# Patient Record
Sex: Female | Born: 1945
Health system: Southern US, Community
[De-identification: ages and names within clinical notes are randomized; demographics above are authoritative.]

## PROBLEM LIST (undated history)

## (undated) DIAGNOSIS — N952 Postmenopausal atrophic vaginitis: Secondary | ICD-10-CM

## (undated) DIAGNOSIS — R5383 Other fatigue: Secondary | ICD-10-CM

## (undated) DIAGNOSIS — R748 Abnormal levels of other serum enzymes: Secondary | ICD-10-CM

## (undated) DIAGNOSIS — J309 Allergic rhinitis, unspecified: Secondary | ICD-10-CM

## (undated) DIAGNOSIS — L309 Dermatitis, unspecified: Secondary | ICD-10-CM

## (undated) DIAGNOSIS — I10 Essential (primary) hypertension: Secondary | ICD-10-CM

## (undated) DIAGNOSIS — M8580 Other specified disorders of bone density and structure, unspecified site: Secondary | ICD-10-CM

## (undated) DIAGNOSIS — I340 Nonrheumatic mitral (valve) insufficiency: Secondary | ICD-10-CM

## (undated) DIAGNOSIS — R519 Headache, unspecified: Secondary | ICD-10-CM

## (undated) DIAGNOSIS — K760 Fatty (change of) liver, not elsewhere classified: Secondary | ICD-10-CM

## (undated) DIAGNOSIS — R109 Unspecified abdominal pain: Secondary | ICD-10-CM

## (undated) DIAGNOSIS — E7800 Pure hypercholesterolemia, unspecified: Secondary | ICD-10-CM

## (undated) DIAGNOSIS — R079 Chest pain, unspecified: Secondary | ICD-10-CM

## (undated) DIAGNOSIS — R000 Tachycardia, unspecified: Secondary | ICD-10-CM

## (undated) HISTORY — DX: Allergic rhinitis, unspecified: J30.9

## (undated) HISTORY — DX: Postmenopausal atrophic vaginitis: N95.2

## (undated) HISTORY — PX: GASTROC RECESSION EXTREMITY: SHX6262

## (undated) HISTORY — DX: Fatty (change of) liver, not elsewhere classified: K76.0

## (undated) HISTORY — DX: Chest pain, unspecified: R07.9

## (undated) HISTORY — PX: OTHER SURGICAL HISTORY: SHX169

## (undated) HISTORY — PX: ABDOMINAL HYSTERECTOMY: SUR658

## (undated) HISTORY — DX: Other fatigue: R53.83

## (undated) HISTORY — DX: Unspecified abdominal pain: R10.9

## (undated) HISTORY — DX: Nonrheumatic mitral (valve) insufficiency: I34.0

## (undated) HISTORY — DX: Other specified disorders of bone density and structure, unspecified site: M85.80

## (undated) HISTORY — DX: Abnormal levels of other serum enzymes: R74.8

## (undated) HISTORY — DX: Dermatitis, unspecified: L30.9

## (undated) HISTORY — DX: Headache, unspecified: R51.9

## (undated) HISTORY — DX: Tachycardia, unspecified: R00.0

## (undated) HISTORY — DX: Pure hypercholesterolemia, unspecified: E78.00

## (undated) HISTORY — DX: Essential (primary) hypertension: I10

---

## 1998-12-30 ENCOUNTER — Other Ambulatory Visit: Admission: RE | Admit: 1998-12-30 | Discharge: 1998-12-30 | Payer: Self-pay | Admitting: *Deleted

## 1999-12-31 ENCOUNTER — Other Ambulatory Visit: Admission: RE | Admit: 1999-12-31 | Discharge: 1999-12-31 | Payer: Self-pay | Admitting: *Deleted

## 2000-08-11 ENCOUNTER — Ambulatory Visit (HOSPITAL_BASED_OUTPATIENT_CLINIC_OR_DEPARTMENT_OTHER): Admission: RE | Admit: 2000-08-11 | Discharge: 2000-08-12 | Payer: Self-pay | Admitting: Orthopedic Surgery

## 2001-01-03 ENCOUNTER — Other Ambulatory Visit: Admission: RE | Admit: 2001-01-03 | Discharge: 2001-01-03 | Payer: Self-pay | Admitting: *Deleted

## 2001-05-20 ENCOUNTER — Ambulatory Visit (HOSPITAL_COMMUNITY): Admission: RE | Admit: 2001-05-20 | Discharge: 2001-05-20 | Payer: Self-pay | Admitting: *Deleted

## 2002-02-15 ENCOUNTER — Ambulatory Visit (HOSPITAL_COMMUNITY): Admission: RE | Admit: 2002-02-15 | Discharge: 2002-02-15 | Payer: Self-pay | Admitting: Internal Medicine

## 2002-02-15 ENCOUNTER — Encounter: Payer: Self-pay | Admitting: Internal Medicine

## 2002-11-02 ENCOUNTER — Ambulatory Visit (HOSPITAL_COMMUNITY): Admission: RE | Admit: 2002-11-02 | Discharge: 2002-11-02 | Payer: Self-pay | Admitting: *Deleted

## 2003-01-11 ENCOUNTER — Other Ambulatory Visit: Admission: RE | Admit: 2003-01-11 | Discharge: 2003-01-11 | Payer: Self-pay | Admitting: Internal Medicine

## 2004-03-27 ENCOUNTER — Ambulatory Visit (HOSPITAL_COMMUNITY): Admission: RE | Admit: 2004-03-27 | Discharge: 2004-03-27 | Payer: Self-pay | Admitting: *Deleted

## 2004-04-11 ENCOUNTER — Ambulatory Visit (HOSPITAL_COMMUNITY): Admission: RE | Admit: 2004-04-11 | Discharge: 2004-04-11 | Payer: Self-pay | Admitting: *Deleted

## 2004-04-11 ENCOUNTER — Encounter (INDEPENDENT_AMBULATORY_CARE_PROVIDER_SITE_OTHER): Payer: Self-pay | Admitting: Specialist

## 2006-02-19 ENCOUNTER — Encounter: Admission: RE | Admit: 2006-02-19 | Discharge: 2006-02-19 | Payer: Self-pay | Admitting: Internal Medicine

## 2006-08-31 ENCOUNTER — Encounter: Admission: RE | Admit: 2006-08-31 | Discharge: 2006-11-29 | Payer: Self-pay | Admitting: Internal Medicine

## 2009-10-29 ENCOUNTER — Encounter: Admission: RE | Admit: 2009-10-29 | Discharge: 2009-10-29 | Payer: Self-pay | Admitting: Internal Medicine

## 2010-12-29 ENCOUNTER — Other Ambulatory Visit: Payer: Self-pay | Admitting: Dermatology

## 2011-05-01 NOTE — Procedures (Signed)
[facility]  Patient:    Kelsey Elliott, Kelsey Elliott                            MRN: [id number] Proc. Date: 05/20/01 Adm. Date:  [id number] Attending:  Sabino Gasser                           Procedure Report  PROCEDURE:  Colonoscopy.  INDICATION FOR PROCEDURE:  Rectal bleeding.  ANESTHESIA:  Demerol 80, Versed 8 mg.  DESCRIPTION OF PROCEDURE:  With the patient mildly sedated in the left lateral decubitus position, the Olympus videoscopic colonoscope was inserted in the rectum and passed under direct vision to the cecum identified by the ileocecal valve and appendiceal orifice both of which were photographed. The prep was slightly suboptimal in that there was brownish tenacious stool that was difficult to suction and had some particulate matter but we were able to clear it from most of the colon. From this point, the colonoscope was slowly withdrawn taking circumferential views of the entire colonic mucosa, stopping only to suction thecal material along the way until we reached the rectum which appeared normal in direct view and showed internal hemorrhoids on retroflexed view. The endoscope was straightened and withdrawn. The patients vital signs and pulse oximeter remained stable. The patient tolerated the procedure well without apparent complications.  FINDINGS:  Internal hemorrhoids otherwise unremarkable examination of the colon to the cecum limited somewhat by suboptimal prep but no gross lesions seen.  PLAN:   Have the patient followup with me in as an outpatient. DD:  05/20/01 TD:  05/20/01 Job: [id number]

## 2011-05-01 NOTE — Op Note (Signed)
   NAMEGAYLYNN, SEIPLE NO.:  [id number]   MEDICAL RECORD NO.:  [id number]                   PATIENT TYPE:  AMB   LOCATION:  ENDO                                 FACILITY:  [facility]   PHYSICIAN:  Georgiana Spinner, M.D.                 DATE OF BIRTH:  June 07, 1946   DATE OF PROCEDURE:  11/02/2002  DATE OF DISCHARGE:                                 OPERATIVE REPORT   PROCEDURE PERFORMED:  Upper endoscopy.   ENDOSCOPIST:  Georgiana Spinner, M.D.   INDICATIONS FOR PROCEDURE:  Abdominal pain, hepatitis.   ANESTHESIA:  Demerol 50 mg, Versed 5 mg.   DESCRIPTION OF PROCEDURE:  With the patient mildly sedated in the left  lateral decubitus position, the Olympus video endoscope was inserted in the  mouth and passed under direct vision through the esophagus which appeared  normal into the stomach.  The fundus, body, antrum, duodenal bulb and second  portion of the duodenum all appeared normal.  From this point, the endoscope  was slowly withdrawn taking circumferential views of the entire duodenal  mucosa until the endoscope was pulled back into the stomach and placed on  retroflexion to view the stomach from below.  The endoscope was then  straightened and withdrawn taking circumferential views of the remaining  gastric and esophageal mucosa.  The patient's vital signs and pulse oximeter  remained stable.  The patient tolerated the procedure well without apparent  complications.   FINDINGS:  This is a negative endoscopic examination.   PLAN:  Have patient follow up with me as an outpatient for further  evaluation of her hepatitis.                                                Georgiana Spinner, M.D.    GMO/MEDQ  D:  11/02/2002  T:  11/02/2002  Job:  [id number]

## 2011-05-01 NOTE — Op Note (Signed)
[facility]  Patient:    Kelsey Elliott, Kelsey Elliott                            MRN: [id number] Proc. Date: 08/11/00 Adm. Date:  [id number] Attending:  Ronne Binning                           Operative Report  PREOPERATIVE DIAGNOSIS:  Carpometacarpal pain, trapezial arthritis left wrist.  POSTOPERATIVE DIAGNOSIS:  Carpometacarpal pain, trapezial arthritis left wrist.  OPERATION:  Trapezium excision, suspension plasty left thumb.  SURGEON:  Nicki Reaper, M.D.  ASSISTANT:  Joaquin Courts, R.N.  ANESTHESIA:  Axillary block.  ANESTHESIOLOGIST:  Edwin Cap. Zoila Shutter, M.D.  HISTORY:  The patient is a 65 year old female with a history of pain and trapezial arthritis, which has not responded to conservative treatment.  She was brought to the operating room, where an axillary block was carried out without difficulty.   PROCEDURE:  She was prepped and draped using Betadine scrub and solution with the left arm free.  The limb was exsanguinated with Esmarch bandage, tourniquet placed high on the arm, was inflated to 250 mmHg.  A curvilinear incision was made over the carpometacarpal joint, carried down through subcutaneous tissue.  Bleeders were electrocauterized.  Radial nerve was identified and protected.  The interval between the extensor pollicis brevis and abductor pollicis longus was opened.  Significant arthritic changes were present with a very significant synovitis.  The radial artery was identified and protected and an incision was made down into the carpometacarpal joint. Significant arthritic changes were present.  The dissection was carried proximally to the STT joint.  The area was opened, a rongeur was then used to remove the bone piecemeal of the trapezium.  A significant erosion was present on the distal aspect of the scaphoid, significant degenerative changes and synovitis were present at the carpometacarpal joint.  The entire trapezium  was removed.  A drill hole was then placed in the dorsal to palmar aspect of the base of the first metacarpal.  This was in a dorsal to palmar direction.  This was brought out in the central aspect of the base of the metacarpal.  The hole was enlarged with a drill bit to approximately ______ size.  The abductor pollicis longus most dorsal aspect was then harvested.  Separate incision was then made at the musculotendinous junction.  A Carroll tendon retriever passed through the first dorsal compartment.  The abductor pollicis longus was ______ detached to the base of the first metacarpal.  This was transected proximally and delivered distally.  The one-third of the flexor carpi radialis was then harvested.  A separate incision was then made over its musculotendinous junction.  This was then delivered distally, splitting it, leaving it attached to the base of the second metacarpal.  The drill hole was then placed in the base of the second metacarpal.  Two Mellody Dance needles were then passed through this drill hole and out through the ulnar aspect of the mid portion of the metacarpal shaft.  Two 0 Ti-Cron sutures were passed of different colors.  The tendons were then woven into the drill hole in the base of the second metacarpal, pulled in snug.  The flexor carpi radialis was then brought through the drill hole in the base of the first metacarpal.  The abductor pollicis longus was also brought through this.  These were  woven together.  The suture for the flexor carpi radialis was tied over the base of the second metacarpal.  The abductor pollicis longus was then pulled into the hole of the second metacarpal base and this suture was also tied over the second metacarpal firmly securing it in position.  This was done with the thumb under full traction.  The tendons were then rewoven back through the drill hole and spiralled around the base of the metacarpal, sutured into position with 4-0 Mersilene  sutures.  An anchovy was then made with the remainder of the tendons, which were then sutured together and sutured to the flexor carpi radialis remainder.  This was done after irrigation, filling the space where the trapezium was removed.  The capsule was then closed with figure-of-eight 4-0 Vicryl sutures.  The subcutaneous tissues with 4-0 Vicryl and the skin wounds were closed with interrupted 5-0 nylon sutures.  A sterile compressive dressing and thumb spica splint was applied.  The patient tolerated the procedure well and was taken to the recovery room for observation in satisfactory condition.  She was admitted for overnight stay for pain control.  She will be discharged on Percocet and Keflex. DD:  08/11/00 TD:  08/11/00 Job: [id number]

## 2012-06-21 ENCOUNTER — Ambulatory Visit: Payer: Self-pay | Admitting: Sports Medicine

## 2012-07-12 ENCOUNTER — Ambulatory Visit (INDEPENDENT_AMBULATORY_CARE_PROVIDER_SITE_OTHER): Payer: MEDICARE | Admitting: Sports Medicine

## 2012-07-12 VITALS — BP 127/78

## 2012-07-12 DIAGNOSIS — M7741 Metatarsalgia, right foot: Secondary | ICD-10-CM | POA: Insufficient documentation

## 2012-07-12 DIAGNOSIS — M79672 Pain in left foot: Secondary | ICD-10-CM | POA: Insufficient documentation

## 2012-07-12 DIAGNOSIS — M7750 Other enthesopathy of unspecified foot: Secondary | ICD-10-CM

## 2012-07-12 DIAGNOSIS — M79609 Pain in unspecified limb: Secondary | ICD-10-CM

## 2012-07-12 DIAGNOSIS — M7742 Metatarsalgia, left foot: Secondary | ICD-10-CM | POA: Insufficient documentation

## 2012-07-12 DIAGNOSIS — M79671 Pain in right foot: Secondary | ICD-10-CM | POA: Insufficient documentation

## 2012-07-12 NOTE — Patient Instructions (Signed)
Please wear the orthotics with the metatarsal pads whenever you are active.   Please try to place metatarsal pads in the other shoes you were frequently, and make sure you wear shoes with a good long arch support.    Please make an appointment for follow up in about a month.

## 2012-07-12 NOTE — Assessment & Plan Note (Signed)
Likely due to collapse of transverse, long arches and pronation.  Her orthotics correct for the long arch and correct her pronation fairly well.  We have added metatarsal pads to her orthotics to correct the transverse arch.  We have sent her home with several more pairs for other shoes.  Follow up in one month.

## 2012-07-12 NOTE — Progress Notes (Signed)
  Subjective:    Patient ID: Kelsey Elliott, female    DOB: [birth year], 66 y.o.   MRN: [id number]  HPI  Kelsey Elliott comes in for left foot pain that has been going on for more than a year.  Her pain is in her forefoot, under the second metatarsal head.  She saw Dr. Fonnie Jarvis who did a gastroc slide surgery.  She says the pain is a little better after the surgery, but she still has a lot of pain in her feet.  She used to do a lot of walking for exercise and this has really limited her ability to do this.  She describes the pain as sudden, stabbing, and sharp, when she walks wrong or hits the bottom of her foot wrong.  No numbness or tingling of the toes or foot. She says that she takes great care to remove a callous on the bottom of her foot with a pumice stone.  She is starting to have some pain in her right foot too, in the same place.  She says she has had a few "twinges" of the same pain in that foot.    She has custom orthotics that were made at the PT place at [facility] orthopedics.  She says they help some with her foot pain.   Review of Systems See HPI    Objective:   Physical Exam BP 127/78 General appearance: alert, cooperative and no distress Feet:  Left foot has some collapse of the transverse and long arch with pronation.  + pain on dorsum of foot at head of second metatarsal Right foot has mild hallux valgus, hammer toes of 3-4, and collapse of transverse and long arches, pronation.  She has decreased varus of left heel with standing on toes compared to right.       Assessment & Plan:

## 2012-07-12 NOTE — Assessment & Plan Note (Signed)
With her history of significant calluses in the forefoot I believe she has classic metatarsalgia  This is helping create the hammertoes with a collapse of the transverse arch  I think we need to try to support this area and she walks more comfortably with metatarsal pads in all in the office  Recheck her after her one-month trial

## 2012-08-12 ENCOUNTER — Ambulatory Visit (INDEPENDENT_AMBULATORY_CARE_PROVIDER_SITE_OTHER): Payer: MEDICARE | Admitting: Sports Medicine

## 2012-08-12 VITALS — BP 123/79 | Ht 62.0 in | Wt 160.0 lb

## 2012-08-12 DIAGNOSIS — M7741 Metatarsalgia, right foot: Secondary | ICD-10-CM

## 2012-08-12 DIAGNOSIS — M7750 Other enthesopathy of unspecified foot: Secondary | ICD-10-CM

## 2012-08-12 DIAGNOSIS — M7742 Metatarsalgia, left foot: Secondary | ICD-10-CM

## 2012-08-12 NOTE — Patient Instructions (Signed)
Given verbal instructions

## 2012-08-12 NOTE — Assessment & Plan Note (Signed)
This is improved since last time we have seen her. Patient seems to be doing very well with her current regimen. Patient is now going to increase her walking regimen and see if she still doesn't have pain. Patient will continue to wear these rubber insoles that she had custom made previously. Once these to wear her out we will consider making her some other custom insoles here that would be most beneficial. Patient will followup as needed.

## 2012-08-12 NOTE — Progress Notes (Signed)
  Subjective:    Patient ID: Kelsey Elliott, female    DOB: [birth year], 66 y.o.   MRN: [id number]  HPI Patient is here for followup of her left foot pain. At last visit patient was given metatarsal pads to put in her custom orthotics. Patient has had a history of gastric slide surgery as well. Patient states since we added this metatarsal pads she is having decrease pain. Patient is even been able to start walking somewhat. Patient states overall she is about 60-70% better and hopefully is going to continue to improve. Patient states the only movement that seems to hurt tissue is when she pivots on the foot in one direction or the other but overall significantly better. Denies any radiation of pain denies any numbness or any swelling.    Review of Systems See HPI    Objective:   Physical Exam BP 123/79  Ht 5\' 2"  (1.575 m)  Wt 160 lb (72.576 kg)  BMI 29.26 kg/m2 General appearance: alert, cooperative and no distress Feet:  Left foot has some collapse of the transverse and long arch with pronation and valgus deformity of the hindfoot.Marland Kitchen  No pain on palpation.  NVI distally.      Assessment & Plan:

## 2012-08-22 ENCOUNTER — Other Ambulatory Visit: Payer: Self-pay | Admitting: Gastroenterology

## 2013-02-16 DIAGNOSIS — Z79899 Other long term (current) drug therapy: Secondary | ICD-10-CM | POA: Insufficient documentation

## 2013-02-16 DIAGNOSIS — E7800 Pure hypercholesterolemia, unspecified: Secondary | ICD-10-CM | POA: Insufficient documentation

## 2013-02-16 DIAGNOSIS — R748 Abnormal levels of other serum enzymes: Secondary | ICD-10-CM | POA: Insufficient documentation

## 2016-01-03 DIAGNOSIS — Z1231 Encounter for screening mammogram for malignant neoplasm of breast: Secondary | ICD-10-CM | POA: Diagnosis not present

## 2016-01-03 DIAGNOSIS — Z803 Family history of malignant neoplasm of breast: Secondary | ICD-10-CM | POA: Diagnosis not present

## 2016-01-08 DIAGNOSIS — E7800 Pure hypercholesterolemia, unspecified: Secondary | ICD-10-CM | POA: Diagnosis not present

## 2016-01-08 DIAGNOSIS — Z0001 Encounter for general adult medical examination with abnormal findings: Secondary | ICD-10-CM | POA: Diagnosis not present

## 2016-01-13 DIAGNOSIS — Z0000 Encounter for general adult medical examination without abnormal findings: Secondary | ICD-10-CM | POA: Diagnosis not present

## 2016-01-13 DIAGNOSIS — Z01419 Encounter for gynecological examination (general) (routine) without abnormal findings: Secondary | ICD-10-CM | POA: Diagnosis not present

## 2016-01-13 DIAGNOSIS — Z1212 Encounter for screening for malignant neoplasm of rectum: Secondary | ICD-10-CM | POA: Diagnosis not present

## 2016-02-12 DIAGNOSIS — H2513 Age-related nuclear cataract, bilateral: Secondary | ICD-10-CM | POA: Diagnosis not present

## 2016-02-12 DIAGNOSIS — H524 Presbyopia: Secondary | ICD-10-CM | POA: Diagnosis not present

## 2016-02-12 DIAGNOSIS — H52203 Unspecified astigmatism, bilateral: Secondary | ICD-10-CM | POA: Diagnosis not present

## 2016-03-10 DIAGNOSIS — R69 Illness, unspecified: Secondary | ICD-10-CM | POA: Diagnosis not present

## 2016-04-03 DIAGNOSIS — M2041 Other hammer toe(s) (acquired), right foot: Secondary | ICD-10-CM | POA: Diagnosis not present

## 2016-04-03 DIAGNOSIS — M2042 Other hammer toe(s) (acquired), left foot: Secondary | ICD-10-CM | POA: Diagnosis not present

## 2016-04-03 DIAGNOSIS — M65871 Other synovitis and tenosynovitis, right ankle and foot: Secondary | ICD-10-CM | POA: Diagnosis not present

## 2016-04-24 DIAGNOSIS — M2041 Other hammer toe(s) (acquired), right foot: Secondary | ICD-10-CM | POA: Diagnosis not present

## 2016-04-24 DIAGNOSIS — M2042 Other hammer toe(s) (acquired), left foot: Secondary | ICD-10-CM | POA: Diagnosis not present

## 2016-04-24 DIAGNOSIS — M65871 Other synovitis and tenosynovitis, right ankle and foot: Secondary | ICD-10-CM | POA: Diagnosis not present

## 2016-05-20 ENCOUNTER — Other Ambulatory Visit: Payer: Self-pay | Admitting: Podiatry

## 2016-05-20 DIAGNOSIS — M2042 Other hammer toe(s) (acquired), left foot: Secondary | ICD-10-CM | POA: Diagnosis not present

## 2016-05-20 DIAGNOSIS — M659 Synovitis and tenosynovitis, unspecified: Secondary | ICD-10-CM

## 2016-05-20 DIAGNOSIS — M65871 Other synovitis and tenosynovitis, right ankle and foot: Secondary | ICD-10-CM | POA: Diagnosis not present

## 2016-05-20 DIAGNOSIS — M25571 Pain in right ankle and joints of right foot: Secondary | ICD-10-CM

## 2016-05-20 DIAGNOSIS — M2041 Other hammer toe(s) (acquired), right foot: Secondary | ICD-10-CM | POA: Diagnosis not present

## 2016-05-25 ENCOUNTER — Ambulatory Visit
Admission: RE | Admit: 2016-05-25 | Discharge: 2016-05-25 | Disposition: A | Discharge status: Home / Self Care | Payer: Medicare HMO | Source: Ambulatory Visit | Attending: Podiatry | Admitting: Podiatry

## 2016-05-25 DIAGNOSIS — M659 Synovitis and tenosynovitis, unspecified: Secondary | ICD-10-CM

## 2016-05-25 DIAGNOSIS — M25571 Pain in right ankle and joints of right foot: Secondary | ICD-10-CM

## 2016-05-25 DIAGNOSIS — M25471 Effusion, right ankle: Secondary | ICD-10-CM | POA: Diagnosis not present

## 2016-06-01 DIAGNOSIS — M2042 Other hammer toe(s) (acquired), left foot: Secondary | ICD-10-CM | POA: Diagnosis not present

## 2016-06-01 DIAGNOSIS — M65871 Other synovitis and tenosynovitis, right ankle and foot: Secondary | ICD-10-CM | POA: Diagnosis not present

## 2016-06-01 DIAGNOSIS — M2041 Other hammer toe(s) (acquired), right foot: Secondary | ICD-10-CM | POA: Diagnosis not present

## 2016-06-10 DIAGNOSIS — M65871 Other synovitis and tenosynovitis, right ankle and foot: Secondary | ICD-10-CM | POA: Diagnosis not present

## 2016-06-12 DIAGNOSIS — M65871 Other synovitis and tenosynovitis, right ankle and foot: Secondary | ICD-10-CM | POA: Diagnosis not present

## 2016-06-30 DIAGNOSIS — M65871 Other synovitis and tenosynovitis, right ankle and foot: Secondary | ICD-10-CM | POA: Diagnosis not present

## 2016-06-30 DIAGNOSIS — M2042 Other hammer toe(s) (acquired), left foot: Secondary | ICD-10-CM | POA: Diagnosis not present

## 2016-06-30 DIAGNOSIS — M2041 Other hammer toe(s) (acquired), right foot: Secondary | ICD-10-CM | POA: Diagnosis not present

## 2016-07-02 DIAGNOSIS — M65871 Other synovitis and tenosynovitis, right ankle and foot: Secondary | ICD-10-CM | POA: Diagnosis not present

## 2016-07-06 DIAGNOSIS — M65871 Other synovitis and tenosynovitis, right ankle and foot: Secondary | ICD-10-CM | POA: Diagnosis not present

## 2016-07-10 DIAGNOSIS — M65871 Other synovitis and tenosynovitis, right ankle and foot: Secondary | ICD-10-CM | POA: Diagnosis not present

## 2016-09-01 DIAGNOSIS — M2041 Other hammer toe(s) (acquired), right foot: Secondary | ICD-10-CM | POA: Diagnosis not present

## 2016-09-01 DIAGNOSIS — M65871 Other synovitis and tenosynovitis, right ankle and foot: Secondary | ICD-10-CM | POA: Diagnosis not present

## 2016-10-14 DIAGNOSIS — R69 Illness, unspecified: Secondary | ICD-10-CM | POA: Diagnosis not present

## 2016-11-25 DIAGNOSIS — H903 Sensorineural hearing loss, bilateral: Secondary | ICD-10-CM | POA: Diagnosis not present

## 2017-01-04 DIAGNOSIS — Z803 Family history of malignant neoplasm of breast: Secondary | ICD-10-CM | POA: Diagnosis not present

## 2017-01-04 DIAGNOSIS — Z1231 Encounter for screening mammogram for malignant neoplasm of breast: Secondary | ICD-10-CM | POA: Diagnosis not present

## 2017-01-15 DIAGNOSIS — Z0000 Encounter for general adult medical examination without abnormal findings: Secondary | ICD-10-CM | POA: Diagnosis not present

## 2017-01-15 DIAGNOSIS — Z13220 Encounter for screening for lipoid disorders: Secondary | ICD-10-CM | POA: Diagnosis not present

## 2017-01-15 DIAGNOSIS — M8580 Other specified disorders of bone density and structure, unspecified site: Secondary | ICD-10-CM | POA: Diagnosis not present

## 2017-01-15 DIAGNOSIS — E7800 Pure hypercholesterolemia, unspecified: Secondary | ICD-10-CM | POA: Diagnosis not present

## 2017-01-15 DIAGNOSIS — Z79899 Other long term (current) drug therapy: Secondary | ICD-10-CM | POA: Diagnosis not present

## 2017-02-12 DIAGNOSIS — M2041 Other hammer toe(s) (acquired), right foot: Secondary | ICD-10-CM | POA: Diagnosis not present

## 2017-02-12 DIAGNOSIS — H52203 Unspecified astigmatism, bilateral: Secondary | ICD-10-CM | POA: Diagnosis not present

## 2017-02-12 DIAGNOSIS — H2513 Age-related nuclear cataract, bilateral: Secondary | ICD-10-CM | POA: Diagnosis not present

## 2017-02-12 DIAGNOSIS — M21961 Unspecified acquired deformity of right lower leg: Secondary | ICD-10-CM | POA: Diagnosis not present

## 2017-02-16 DIAGNOSIS — M2041 Other hammer toe(s) (acquired), right foot: Secondary | ICD-10-CM | POA: Diagnosis not present

## 2017-02-16 DIAGNOSIS — M24575 Contracture, left foot: Secondary | ICD-10-CM | POA: Diagnosis not present

## 2017-02-22 DIAGNOSIS — M24574 Contracture, right foot: Secondary | ICD-10-CM | POA: Diagnosis not present

## 2017-02-22 DIAGNOSIS — M2041 Other hammer toe(s) (acquired), right foot: Secondary | ICD-10-CM | POA: Diagnosis not present

## 2017-02-26 DIAGNOSIS — M2041 Other hammer toe(s) (acquired), right foot: Secondary | ICD-10-CM | POA: Diagnosis not present

## 2017-03-25 DIAGNOSIS — M2041 Other hammer toe(s) (acquired), right foot: Secondary | ICD-10-CM | POA: Diagnosis not present

## 2017-04-08 DIAGNOSIS — M2041 Other hammer toe(s) (acquired), right foot: Secondary | ICD-10-CM | POA: Diagnosis not present

## 2017-04-14 DIAGNOSIS — R69 Illness, unspecified: Secondary | ICD-10-CM | POA: Diagnosis not present

## 2017-04-27 DIAGNOSIS — E7800 Pure hypercholesterolemia, unspecified: Secondary | ICD-10-CM | POA: Diagnosis not present

## 2017-04-27 DIAGNOSIS — Z01419 Encounter for gynecological examination (general) (routine) without abnormal findings: Secondary | ICD-10-CM | POA: Diagnosis not present

## 2017-04-27 DIAGNOSIS — R5383 Other fatigue: Secondary | ICD-10-CM | POA: Diagnosis not present

## 2017-04-27 DIAGNOSIS — Z0000 Encounter for general adult medical examination without abnormal findings: Secondary | ICD-10-CM | POA: Diagnosis not present

## 2017-04-27 DIAGNOSIS — E559 Vitamin D deficiency, unspecified: Secondary | ICD-10-CM | POA: Diagnosis not present

## 2017-04-27 DIAGNOSIS — M8580 Other specified disorders of bone density and structure, unspecified site: Secondary | ICD-10-CM | POA: Diagnosis not present

## 2017-04-28 ENCOUNTER — Other Ambulatory Visit: Payer: Self-pay | Admitting: Internal Medicine

## 2017-04-28 DIAGNOSIS — R0989 Other specified symptoms and signs involving the circulatory and respiratory systems: Secondary | ICD-10-CM

## 2017-05-03 ENCOUNTER — Ambulatory Visit
Admission: RE | Admit: 2017-05-03 | Discharge: 2017-05-03 | Disposition: A | Discharge status: Home / Self Care | Payer: Medicare HMO | Source: Ambulatory Visit | Attending: Internal Medicine | Admitting: Internal Medicine

## 2017-05-03 DIAGNOSIS — I6523 Occlusion and stenosis of bilateral carotid arteries: Secondary | ICD-10-CM | POA: Diagnosis not present

## 2017-05-03 DIAGNOSIS — R0989 Other specified symptoms and signs involving the circulatory and respiratory systems: Secondary | ICD-10-CM

## 2017-05-26 DIAGNOSIS — H6121 Impacted cerumen, right ear: Secondary | ICD-10-CM | POA: Diagnosis not present

## 2017-08-19 DIAGNOSIS — M8580 Other specified disorders of bone density and structure, unspecified site: Secondary | ICD-10-CM | POA: Diagnosis not present

## 2017-08-19 DIAGNOSIS — M859 Disorder of bone density and structure, unspecified: Secondary | ICD-10-CM | POA: Diagnosis not present

## 2017-09-02 DIAGNOSIS — R69 Illness, unspecified: Secondary | ICD-10-CM | POA: Diagnosis not present

## 2017-10-28 DIAGNOSIS — M8580 Other specified disorders of bone density and structure, unspecified site: Secondary | ICD-10-CM | POA: Diagnosis not present

## 2017-11-18 DIAGNOSIS — R69 Illness, unspecified: Secondary | ICD-10-CM | POA: Diagnosis not present

## 2017-11-24 DIAGNOSIS — H903 Sensorineural hearing loss, bilateral: Secondary | ICD-10-CM | POA: Diagnosis not present

## 2017-11-24 DIAGNOSIS — H6123 Impacted cerumen, bilateral: Secondary | ICD-10-CM | POA: Diagnosis not present

## 2018-01-05 DIAGNOSIS — Z1231 Encounter for screening mammogram for malignant neoplasm of breast: Secondary | ICD-10-CM | POA: Diagnosis not present

## 2018-02-14 DIAGNOSIS — H2513 Age-related nuclear cataract, bilateral: Secondary | ICD-10-CM | POA: Diagnosis not present

## 2018-02-14 DIAGNOSIS — H52203 Unspecified astigmatism, bilateral: Secondary | ICD-10-CM | POA: Diagnosis not present

## 2018-04-26 DIAGNOSIS — N390 Urinary tract infection, site not specified: Secondary | ICD-10-CM | POA: Diagnosis not present

## 2018-04-26 DIAGNOSIS — E7800 Pure hypercholesterolemia, unspecified: Secondary | ICD-10-CM | POA: Diagnosis not present

## 2018-04-26 DIAGNOSIS — Z0000 Encounter for general adult medical examination without abnormal findings: Secondary | ICD-10-CM | POA: Diagnosis not present

## 2018-05-03 DIAGNOSIS — K635 Polyp of colon: Secondary | ICD-10-CM | POA: Diagnosis not present

## 2018-05-03 DIAGNOSIS — Z0000 Encounter for general adult medical examination without abnormal findings: Secondary | ICD-10-CM | POA: Diagnosis not present

## 2018-05-03 DIAGNOSIS — E7800 Pure hypercholesterolemia, unspecified: Secondary | ICD-10-CM | POA: Diagnosis not present

## 2018-05-03 DIAGNOSIS — Z01419 Encounter for gynecological examination (general) (routine) without abnormal findings: Secondary | ICD-10-CM | POA: Diagnosis not present

## 2018-05-17 DIAGNOSIS — L309 Dermatitis, unspecified: Secondary | ICD-10-CM | POA: Diagnosis not present

## 2018-05-17 DIAGNOSIS — J309 Allergic rhinitis, unspecified: Secondary | ICD-10-CM | POA: Diagnosis not present

## 2018-06-01 DIAGNOSIS — R69 Illness, unspecified: Secondary | ICD-10-CM | POA: Diagnosis not present

## 2018-09-04 DIAGNOSIS — R69 Illness, unspecified: Secondary | ICD-10-CM | POA: Diagnosis not present

## 2018-09-15 DIAGNOSIS — Z86010 Personal history of colonic polyps: Secondary | ICD-10-CM | POA: Diagnosis not present

## 2018-09-15 DIAGNOSIS — K648 Other hemorrhoids: Secondary | ICD-10-CM | POA: Diagnosis not present

## 2018-09-15 DIAGNOSIS — K5730 Diverticulosis of large intestine without perforation or abscess without bleeding: Secondary | ICD-10-CM | POA: Diagnosis not present

## 2018-11-23 DIAGNOSIS — T162XXA Foreign body in left ear, initial encounter: Secondary | ICD-10-CM | POA: Diagnosis not present

## 2018-11-23 DIAGNOSIS — H903 Sensorineural hearing loss, bilateral: Secondary | ICD-10-CM | POA: Diagnosis not present

## 2018-12-20 DIAGNOSIS — R69 Illness, unspecified: Secondary | ICD-10-CM | POA: Diagnosis not present

## 2018-12-26 DIAGNOSIS — R69 Illness, unspecified: Secondary | ICD-10-CM | POA: Diagnosis not present

## 2019-01-11 DIAGNOSIS — Z1231 Encounter for screening mammogram for malignant neoplasm of breast: Secondary | ICD-10-CM | POA: Diagnosis not present

## 2019-01-11 DIAGNOSIS — Z803 Family history of malignant neoplasm of breast: Secondary | ICD-10-CM | POA: Diagnosis not present

## 2019-02-17 DIAGNOSIS — H52203 Unspecified astigmatism, bilateral: Secondary | ICD-10-CM | POA: Diagnosis not present

## 2019-02-17 DIAGNOSIS — H2513 Age-related nuclear cataract, bilateral: Secondary | ICD-10-CM | POA: Diagnosis not present

## 2019-02-17 DIAGNOSIS — H40013 Open angle with borderline findings, low risk, bilateral: Secondary | ICD-10-CM | POA: Diagnosis not present

## 2019-06-21 DIAGNOSIS — R69 Illness, unspecified: Secondary | ICD-10-CM | POA: Diagnosis not present

## 2019-09-29 DIAGNOSIS — Z23 Encounter for immunization: Secondary | ICD-10-CM | POA: Diagnosis not present

## 2019-11-22 DIAGNOSIS — H6121 Impacted cerumen, right ear: Secondary | ICD-10-CM | POA: Diagnosis not present

## 2019-11-22 DIAGNOSIS — H903 Sensorineural hearing loss, bilateral: Secondary | ICD-10-CM | POA: Diagnosis not present

## 2019-12-31 ENCOUNTER — Ambulatory Visit: Payer: Medicare Other | Attending: Internal Medicine

## 2019-12-31 DIAGNOSIS — Z23 Encounter for immunization: Secondary | ICD-10-CM | POA: Insufficient documentation

## 2020-01-02 DIAGNOSIS — R69 Illness, unspecified: Secondary | ICD-10-CM | POA: Diagnosis not present

## 2020-01-17 DIAGNOSIS — Z1231 Encounter for screening mammogram for malignant neoplasm of breast: Secondary | ICD-10-CM | POA: Diagnosis not present

## 2020-01-19 ENCOUNTER — Ambulatory Visit: Payer: Medicare HMO | Attending: Internal Medicine

## 2020-01-19 DIAGNOSIS — Z23 Encounter for immunization: Secondary | ICD-10-CM | POA: Insufficient documentation

## 2020-01-19 NOTE — Progress Notes (Signed)
   Covid-19 Vaccination Clinic  Name:  AVYNN KLASSEN    MRN: [id number] DOB: Mar 18, 1946  01/19/2020  Ms. Pertuit was observed post Covid-19 immunization for 15 minutes without incidence. She was provided with Vaccine Information Sheet and instruction to access the V-Safe system.   Ms. Blacklock was instructed to call 911 with any severe reactions post vaccine: Marland Kitchen Difficulty breathing  . Swelling of your face and throat  . A fast heartbeat  . A bad rash all over your body  . Dizziness and weakness    Immunizations Administered    Name Date Dose VIS Date Route   Pfizer COVID-19 Vaccine 01/19/2020  2:47 PM 0.3 mL 11/24/2019 Intramuscular   Manufacturer: [employer], [employer]   Lot: [id number]   NDC: [address]-9

## 2020-02-13 DIAGNOSIS — M8580 Other specified disorders of bone density and structure, unspecified site: Secondary | ICD-10-CM | POA: Diagnosis not present

## 2020-02-13 DIAGNOSIS — E7800 Pure hypercholesterolemia, unspecified: Secondary | ICD-10-CM | POA: Diagnosis not present

## 2020-02-13 DIAGNOSIS — E559 Vitamin D deficiency, unspecified: Secondary | ICD-10-CM | POA: Diagnosis not present

## 2020-02-13 DIAGNOSIS — Z0000 Encounter for general adult medical examination without abnormal findings: Secondary | ICD-10-CM | POA: Diagnosis not present

## 2020-02-20 DIAGNOSIS — Z01419 Encounter for gynecological examination (general) (routine) without abnormal findings: Secondary | ICD-10-CM | POA: Diagnosis not present

## 2020-02-20 DIAGNOSIS — Z1212 Encounter for screening for malignant neoplasm of rectum: Secondary | ICD-10-CM | POA: Diagnosis not present

## 2020-02-20 DIAGNOSIS — M8589 Other specified disorders of bone density and structure, multiple sites: Secondary | ICD-10-CM | POA: Diagnosis not present

## 2020-02-20 DIAGNOSIS — Z0000 Encounter for general adult medical examination without abnormal findings: Secondary | ICD-10-CM | POA: Diagnosis not present

## 2020-02-20 DIAGNOSIS — M8580 Other specified disorders of bone density and structure, unspecified site: Secondary | ICD-10-CM | POA: Diagnosis not present

## 2020-02-21 DIAGNOSIS — H2513 Age-related nuclear cataract, bilateral: Secondary | ICD-10-CM | POA: Diagnosis not present

## 2020-02-21 DIAGNOSIS — H52203 Unspecified astigmatism, bilateral: Secondary | ICD-10-CM | POA: Diagnosis not present

## 2020-03-26 DIAGNOSIS — M810 Age-related osteoporosis without current pathological fracture: Secondary | ICD-10-CM | POA: Diagnosis not present

## 2020-04-18 DIAGNOSIS — H25811 Combined forms of age-related cataract, right eye: Secondary | ICD-10-CM | POA: Diagnosis not present

## 2020-04-18 DIAGNOSIS — H2511 Age-related nuclear cataract, right eye: Secondary | ICD-10-CM | POA: Diagnosis not present

## 2020-05-09 DIAGNOSIS — H2512 Age-related nuclear cataract, left eye: Secondary | ICD-10-CM | POA: Diagnosis not present

## 2020-05-28 DIAGNOSIS — M810 Age-related osteoporosis without current pathological fracture: Secondary | ICD-10-CM | POA: Diagnosis not present

## 2020-09-13 DIAGNOSIS — R69 Illness, unspecified: Secondary | ICD-10-CM | POA: Diagnosis not present

## 2020-09-24 DIAGNOSIS — Z961 Presence of intraocular lens: Secondary | ICD-10-CM | POA: Diagnosis not present

## 2020-10-15 DIAGNOSIS — R69 Illness, unspecified: Secondary | ICD-10-CM | POA: Diagnosis not present

## 2020-10-23 ENCOUNTER — Emergency Department (HOSPITAL_COMMUNITY): Payer: Medicare HMO

## 2020-10-23 ENCOUNTER — Encounter (HOSPITAL_COMMUNITY): Payer: Self-pay

## 2020-10-23 ENCOUNTER — Emergency Department (HOSPITAL_COMMUNITY)
Admission: EM | Admit: 2020-10-23 | Discharge: 2020-10-23 | Disposition: A | Discharge status: Home / Self Care | Payer: Medicare HMO | Attending: Emergency Medicine | Admitting: Emergency Medicine

## 2020-10-23 DIAGNOSIS — R030 Elevated blood-pressure reading, without diagnosis of hypertension: Secondary | ICD-10-CM | POA: Diagnosis not present

## 2020-10-23 DIAGNOSIS — I6782 Cerebral ischemia: Secondary | ICD-10-CM | POA: Diagnosis not present

## 2020-10-23 DIAGNOSIS — R519 Headache, unspecified: Secondary | ICD-10-CM

## 2020-10-23 DIAGNOSIS — R059 Cough, unspecified: Secondary | ICD-10-CM | POA: Diagnosis not present

## 2020-10-23 DIAGNOSIS — R000 Tachycardia, unspecified: Secondary | ICD-10-CM | POA: Diagnosis not present

## 2020-10-23 DIAGNOSIS — R002 Palpitations: Secondary | ICD-10-CM | POA: Insufficient documentation

## 2020-10-23 DIAGNOSIS — I10 Essential (primary) hypertension: Secondary | ICD-10-CM | POA: Diagnosis not present

## 2020-10-23 DIAGNOSIS — R531 Weakness: Secondary | ICD-10-CM | POA: Diagnosis not present

## 2020-10-23 LAB — BASIC METABOLIC PANEL
Anion gap: 10 (ref 5–15)
BUN: 12 mg/dL (ref 8–23)
CO2: 27 mmol/L (ref 22–32)
Calcium: 9.4 mg/dL (ref 8.9–10.3)
Chloride: 105 mmol/L (ref 98–111)
Creatinine, Ser: 0.6 mg/dL (ref 0.44–1.00)
GFR, Estimated: >60 mL/min (ref >60)
Glucose, Bld: 132 mg/dL — ABNORMAL HIGH (ref 70–99)
Potassium: 3.9 mmol/L (ref 3.5–5.1)
Sodium: 142 mmol/L (ref 135–145)

## 2020-10-23 LAB — D-DIMER, QUANTITATIVE: D-Dimer, Quant: 0.34 ug/mL-FEU (ref 0.00–0.50)

## 2020-10-23 LAB — URINALYSIS, ROUTINE W REFLEX MICROSCOPIC
Bilirubin Urine: NEGATIVE
Glucose, UA: NEGATIVE mg/dL
Hgb urine dipstick: NEGATIVE
Ketones, ur: NEGATIVE mg/dL
Leukocytes,Ua: NEGATIVE
Nitrite: NEGATIVE
Protein, ur: NEGATIVE mg/dL
Specific Gravity, Urine: 1.004 — ABNORMAL LOW (ref 1.005–1.030)
pH: 7 (ref 5.0–8.0)

## 2020-10-23 LAB — HEPATIC FUNCTION PANEL
ALT: 19 U/L (ref 0–44)
AST: 28 U/L (ref 15–41)
Albumin: 4.2 g/dL (ref 3.5–5.0)
Alkaline Phosphatase: 62 U/L (ref 38–126)
Bilirubin, Direct: 0.1 mg/dL (ref 0.0–0.2)
Indirect Bilirubin: 0.5 mg/dL (ref 0.3–0.9)
Total Bilirubin: 0.6 mg/dL (ref 0.3–1.2)
Total Protein: 7 g/dL (ref 6.5–8.1)

## 2020-10-23 LAB — CBC
HCT: 42.8 % (ref 36.0–46.0)
Hemoglobin: 14 g/dL (ref 12.0–15.0)
MCH: 30.8 pg (ref 26.0–34.0)
MCHC: 32.7 g/dL (ref 30.0–36.0)
MCV: 94.3 fL (ref 80.0–100.0)
Platelets: 204 10*3/uL (ref 150–400)
RBC: 4.54 MIL/uL (ref 3.87–5.11)
RDW: 12.7 % (ref 11.5–15.5)
WBC: 6.4 10*3/uL (ref 4.0–10.5)
nRBC: 0 % (ref 0.0–0.2)

## 2020-10-23 LAB — TROPONIN I (HIGH SENSITIVITY)
Troponin I (High Sensitivity): 2 ng/L (ref <18)
Troponin I (High Sensitivity): 2 ng/L (ref <18)

## 2020-10-23 LAB — PROTIME-INR
INR: 1 (ref 0.8–1.2)
Prothrombin Time: 12.8 seconds (ref 11.4–15.2)

## 2020-10-23 MED ORDER — SODIUM CHLORIDE 0.9 % IV BOLUS
500.0000 mL | Freq: Once | INTRAVENOUS | Status: AC
  Administered 2020-10-23: 500 mL via INTRAVENOUS

## 2020-10-23 MED ORDER — METOCLOPRAMIDE HCL 5 MG/ML IJ SOLN
10.0000 mg | Freq: Once | INTRAMUSCULAR | Status: AC
  Administered 2020-10-23: 10 mg via INTRAVENOUS
  Filled 2020-10-23: qty 2

## 2020-10-23 MED ORDER — IBUPROFEN 200 MG PO TABS
400.0000 mg | ORAL_TABLET | Freq: Once | ORAL | Status: AC
  Administered 2020-10-23: 400 mg via ORAL
  Filled 2020-10-23: qty 2

## 2020-10-23 NOTE — ED Triage Notes (Signed)
Pt presents with c/o chest palpitations and weakness. Pt was hypertensive for EMS, BP 180/100, no hx of same.

## 2020-10-23 NOTE — ED Provider Notes (Signed)
[facility]-EMERGENCY DEPT Provider Note   CSN: [id number] Arrival date & time: 10/23/20  1507     History Chief Complaint  Patient presents with  . Weakness  . Hypertension    Kelsey Elliott is a 74 y.o. female.  The history is provided by the patient and medical records.  Weakness Hypertension   Kelsey Elliott is a 74 y.o. female who presents to the Emergency Department complaining of weakness.  Sxs started this morning following her morning exercise routine.  She felt fluttering in her chest with mild HA.  She checked her HR and BP - HR was 100-104, BP 170/80.  She usually has normal blood pressure.  HA is waxing and waning.  Has mild cough due to PSND.    She denies chest pain, SOB, fever, abdominal pain,  N/V/D, leg swelling/pain.  No known sick contacts.  Has been fully vaccinated for COVID19 plus booster.  No significant medical problems.  No hx/o blood clots.        History reviewed. No pertinent past medical history.  Patient Active Problem List   Diagnosis Date Noted  . Foot pain, bilateral 07/12/2012  . Metatarsalgia of both feet 07/12/2012    History reviewed. No pertinent surgical history.   OB History   No obstetric history on file.     History reviewed. No pertinent family history.  Social History   Tobacco Use  . Smoking status: Not on file  Substance Use Topics  . Alcohol use: Not on file  . Drug use: Not on file    Home Medications Prior to Admission medications   Medication Sig Start Date End Date Taking? Authorizing Provider  Ascorbic Acid (VITAMIN C) 1000 MG tablet Take 1,000 mg by mouth daily.   Yes [provider]  cholecalciferol (VITAMIN D3) 25 MCG (1000 UNIT) tablet Take 1,000 Units by mouth daily.   Yes [provider]  ibuprofen (ADVIL) 200 MG tablet Take 400 mg by mouth every 6 (six) hours as needed for moderate pain.   Yes [provider]  Multiple Vitamin (MULTIVITAMIN WITH MINERALS)  TABS tablet Take 1 tablet by mouth daily.   Yes [provider]  Probiotic Product (PROBIOTIC ADVANCED PO) Take 1 capsule by mouth daily.   Yes [provider]    Allergies    Piroxicam, Compazine [prochlorperazine edisylate], and Peroxidase  Review of Systems   Review of Systems  Neurological: Positive for weakness.  All other systems reviewed and are negative.   Physical Exam Updated Vital Signs BP (!) 158/111   Pulse 91   Temp 98.1 F (36.7 C) (Oral)   Resp 14   SpO2 97%   Physical Exam Vitals and nursing note reviewed.  Constitutional:      Appearance: She is well-developed.  HENT:     Head: Normocephalic and atraumatic.  Cardiovascular:     Rate and Rhythm: Regular rhythm. Tachycardia present.     Heart sounds: No murmur heard.   Pulmonary:     Effort: Pulmonary effort is normal. No respiratory distress.     Breath sounds: Normal breath sounds.  Abdominal:     Palpations: Abdomen is soft.     Tenderness: There is no abdominal tenderness. There is no guarding or rebound.  Musculoskeletal:        General: No swelling or tenderness.  Skin:    General: Skin is warm and dry.  Neurological:     Mental Status: She is alert and  oriented to person, place, and time.  Psychiatric:        Behavior: Behavior normal.     ED Results / Procedures / Treatments   Labs (all labs ordered are listed, but only abnormal results are displayed) Labs Reviewed  BASIC METABOLIC PANEL - Abnormal; Notable for the following components:      Result Value   Glucose, Bld 132 (*)    All other components within normal limits  URINALYSIS, ROUTINE W REFLEX MICROSCOPIC - Abnormal; Notable for the following components:   Color, Urine STRAW (*)    Specific Gravity, Urine 1.004 (*)    All other components within normal limits  CBC  PROTIME-INR  HEPATIC FUNCTION PANEL  D-DIMER, QUANTITATIVE (NOT AT [facility])  TROPONIN I (HIGH SENSITIVITY)  TROPONIN I (HIGH SENSITIVITY)     EKG EKG Interpretation  Date/Time:  Wednesday October 23 2020 15:16:39 EST Ventricular Rate:  98 PR Interval:    QRS Duration: 81 QT Interval:  335 QTC Calculation: 428 R Axis:   42 Text Interpretation: Sinus rhythm Consider anterior infarct 12 Lead; Mason-Likar Confirmed by Tilden Fossa [id number]) on 10/23/2020 6:34:49 PM   Radiology CT Head Wo Contrast  Result Date: 10/23/2020 CLINICAL DATA:  74 year old female with headache. Concern for intracranial bleed. EXAM: CT HEAD WITHOUT CONTRAST TECHNIQUE: Contiguous axial images were obtained from the base of the skull through the vertex without intravenous contrast. COMPARISON:  None. FINDINGS: Brain: There is mild age-related atrophy and chronic microvascular ischemic changes. There is no acute intracranial hemorrhage. No mass effect or midline shift. No extra-axial fluid collection. Vascular: No hyperdense vessel or unexpected calcification. Skull: Normal. Negative for fracture or focal lesion. Sinuses/Orbits: No acute finding. Other: None IMPRESSION: 1. No acute intracranial pathology. 2. Mild age-related atrophy and chronic microvascular ischemic changes. Electronically Signed   By: Elgie Collard M.D.   On: 10/23/2020 19:53    Procedures Procedures (including critical care time)  Medications Ordered in ED Medications  sodium chloride 0.9 % bolus 500 mL (0 mLs Intravenous Stopped 10/23/20 2231)  metoCLOPramide (REGLAN) injection 10 mg (10 mg Intravenous Given 10/23/20 1927)  ibuprofen (ADVIL) tablet 400 mg (400 mg Oral Given 10/23/20 2231)    ED Course  I have reviewed the triage vital signs and the nursing notes.  Pertinent labs & imaging results that were available during my care of the patient were reviewed by me and considered in my medical decision making (see chart for details).    MDM Rules/Calculators/A&P                         Patient here for evaluation of elevated blood pressure, mild headache and fluttering  in her chest that began earlier today. On examination she is non-toxic, no focal neurologic deficits. EKG with sinus rhythm. She is noted to be hypertensive in the emergency department, blood pressure did improve with treatment of her headache. On reassessment headache is improved but still does have a mild headache. Labs without significant electrolyte abnormality, kidney injury or hepatic injury. UA is not consistent with UTI. CBC is within normal limits. Troponin's are negative times two. Current presentation is not consistent with CVA, hypertensive urgency, PE, ACS. Discussed with patient unclear source of her current symptoms. But do not start antihypertensives at this point in time given no prior history of hypertension. Discussed outpatient follow-up as well as return precautions.  Final Clinical Impression(s) / ED Diagnoses Final diagnoses:  Bad headache  Elevated blood  pressure reading in office without diagnosis of hypertension    Rx / DC Orders ED Discharge Orders    None       Tilden Fossa, MD 10/23/20 2243

## 2020-11-04 DIAGNOSIS — R000 Tachycardia, unspecified: Secondary | ICD-10-CM | POA: Diagnosis not present

## 2020-11-04 DIAGNOSIS — I10 Essential (primary) hypertension: Secondary | ICD-10-CM | POA: Diagnosis not present

## 2020-11-25 DIAGNOSIS — R69 Illness, unspecified: Secondary | ICD-10-CM | POA: Diagnosis not present

## 2020-11-27 DIAGNOSIS — H6121 Impacted cerumen, right ear: Secondary | ICD-10-CM | POA: Diagnosis not present

## 2020-11-27 DIAGNOSIS — H903 Sensorineural hearing loss, bilateral: Secondary | ICD-10-CM | POA: Diagnosis not present

## 2020-12-02 DIAGNOSIS — R000 Tachycardia, unspecified: Secondary | ICD-10-CM | POA: Diagnosis not present

## 2020-12-02 DIAGNOSIS — I10 Essential (primary) hypertension: Secondary | ICD-10-CM | POA: Diagnosis not present

## 2020-12-02 DIAGNOSIS — M810 Age-related osteoporosis without current pathological fracture: Secondary | ICD-10-CM | POA: Diagnosis not present

## 2020-12-04 ENCOUNTER — Telehealth: Payer: Self-pay

## 2020-12-04 NOTE — Telephone Encounter (Signed)
NOTES ON FILE FROM [facility] [phone], SENT REFERRAL TO SCHEDULING 

## 2020-12-08 IMAGING — CT CT HEAD W/O CM
3 series · 16 of 47 positions shown, 19 images · non-contrast
Comparison: None.

CLINICAL DATA: 74-year-old female with headache. Concern for
intracranial bleed.

EXAM:
CT HEAD WITHOUT CONTRAST
TECHNIQUE: Contiguous axial images were obtained from the base of the skull
through the vertex without intravenous contrast.

[Series 2: head wo · axial · 0.44mm/px · z∈[-188,-58]mm · 10 of 32 slices shown, 13 images]
[im 3/32  brain]
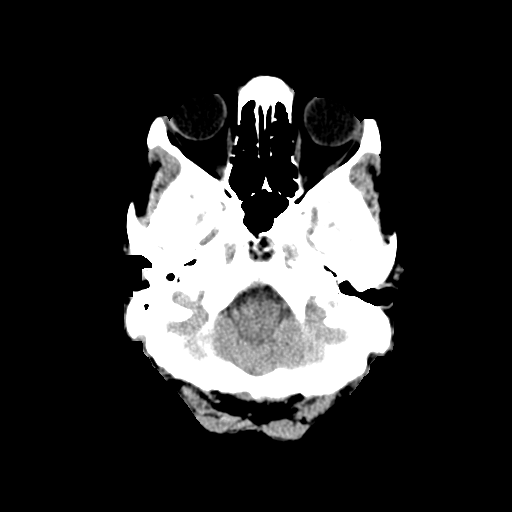
[im 3/32  bone]
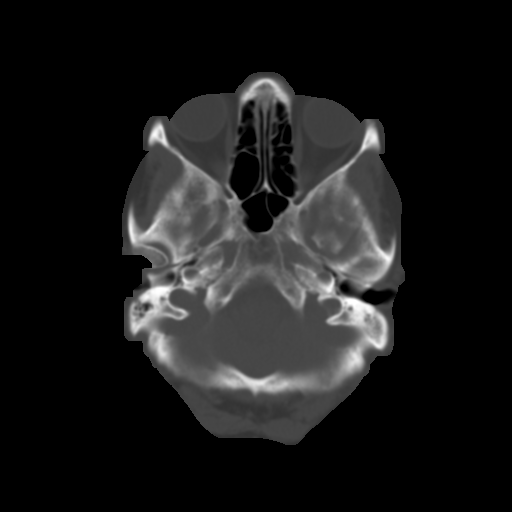
[im 6/32  brain]
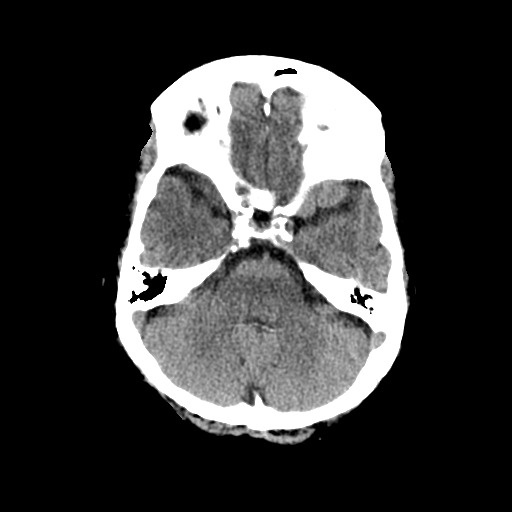
[im 9/32  brain]
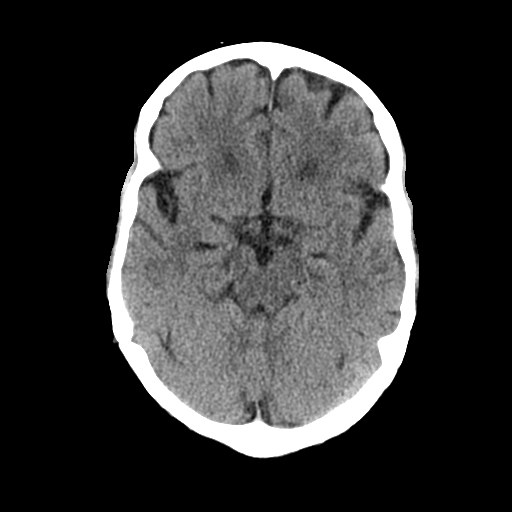
[im 11/32  brain]
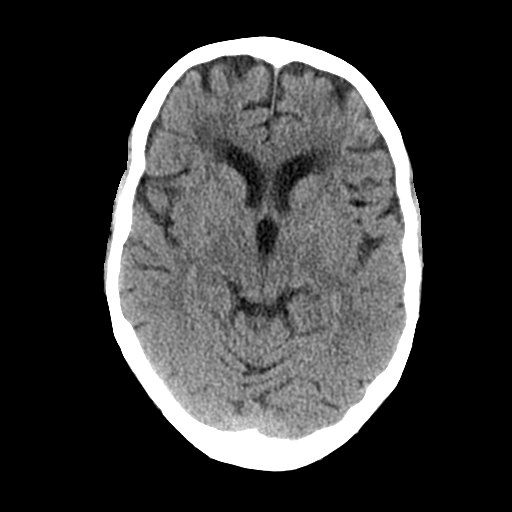
[im 14/32  brain]
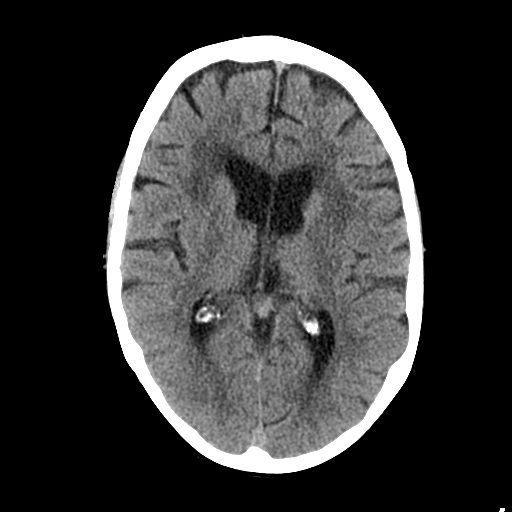
[im 14/32  bone]
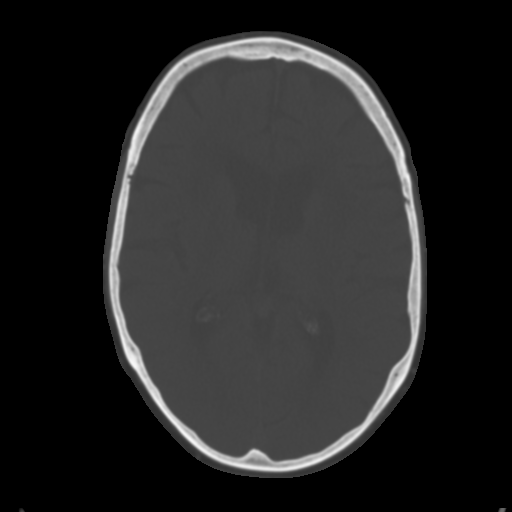
[im 18/32  brain]
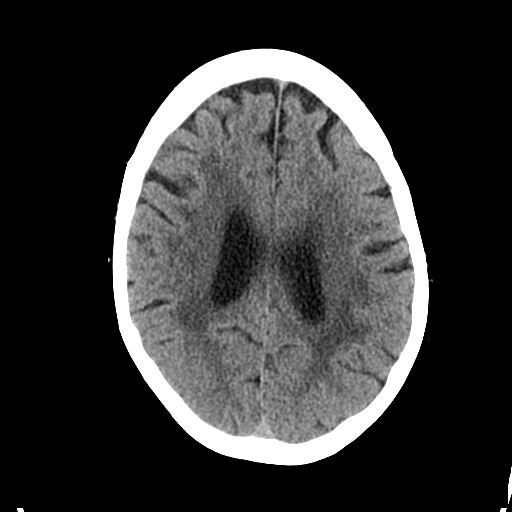
[im 21/32  brain]
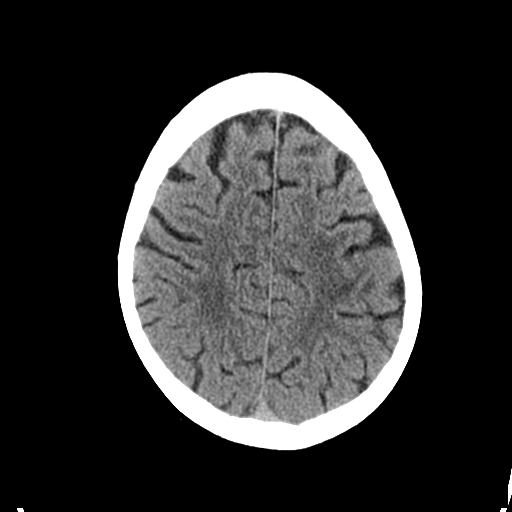
[im 24/32  brain]
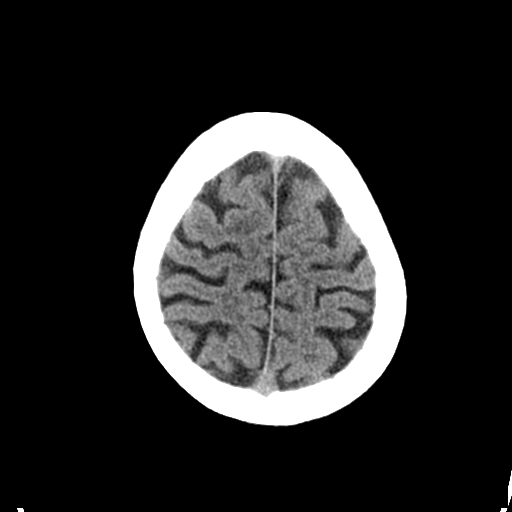
[im 26/32  brain]
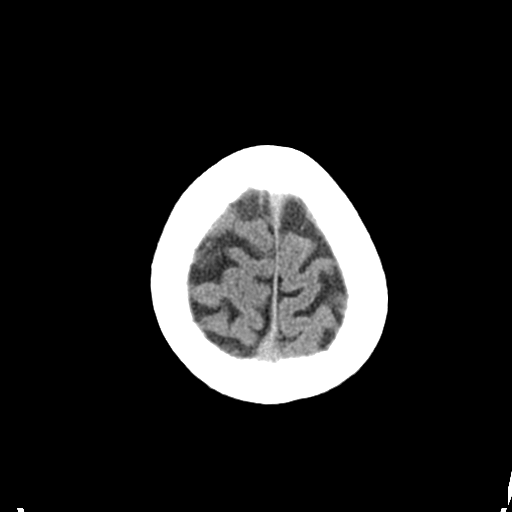
[im 26/32  bone]
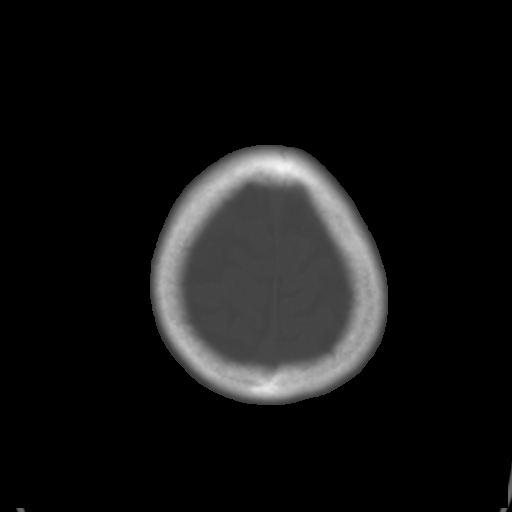
[im 29/32  brain]
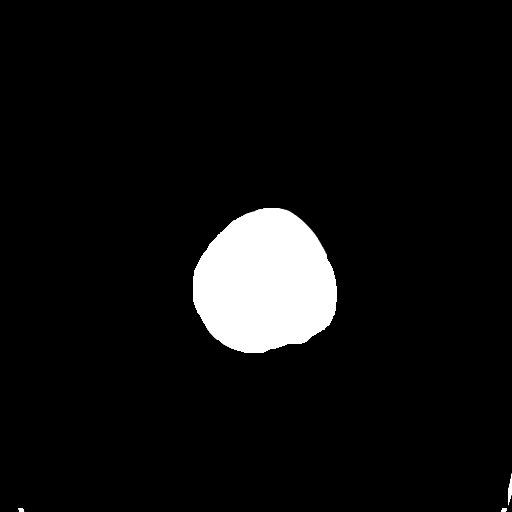

[Series 5: coronal soft tissue · coronal · 0.30mm/px · 3 of 68 slices shown]
[im 23/68  brain]
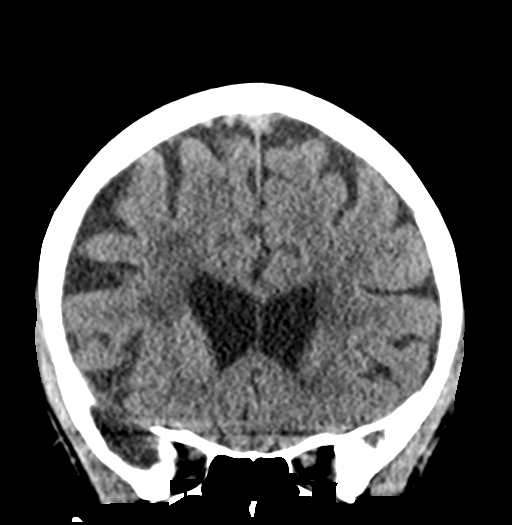
[im 30/68  brain]
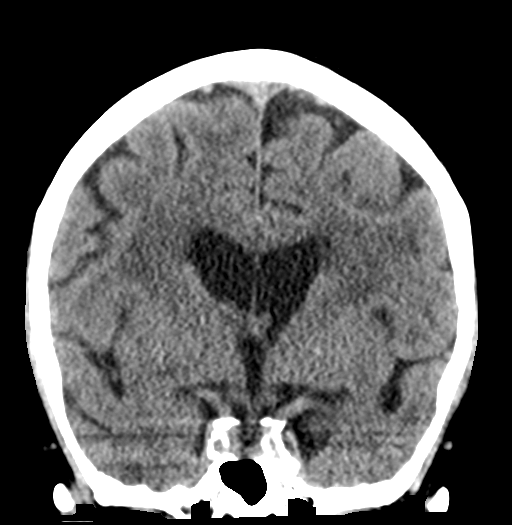
[im 38/68  brain]
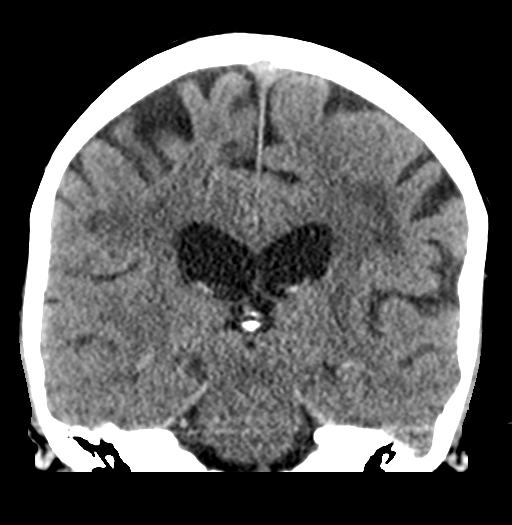

[Series 6: sagittal soft tissue · sagittal · 0.31mm/px · 3 of 52 slices shown]
[im 18/52  brain]
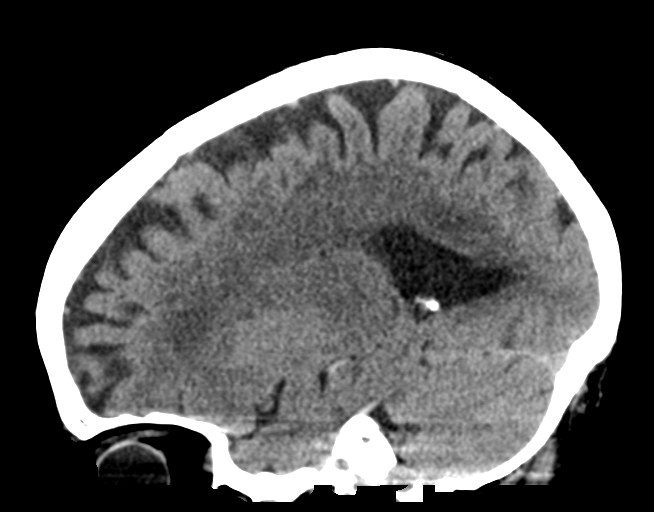
[im 26/52  brain]
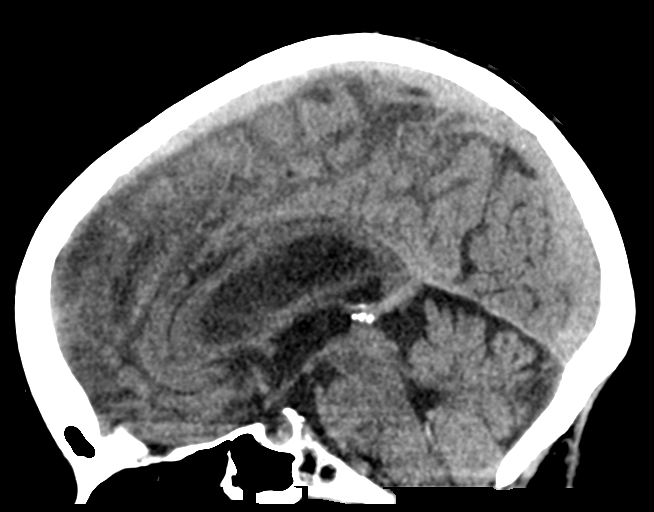
[im 35/52  brain]
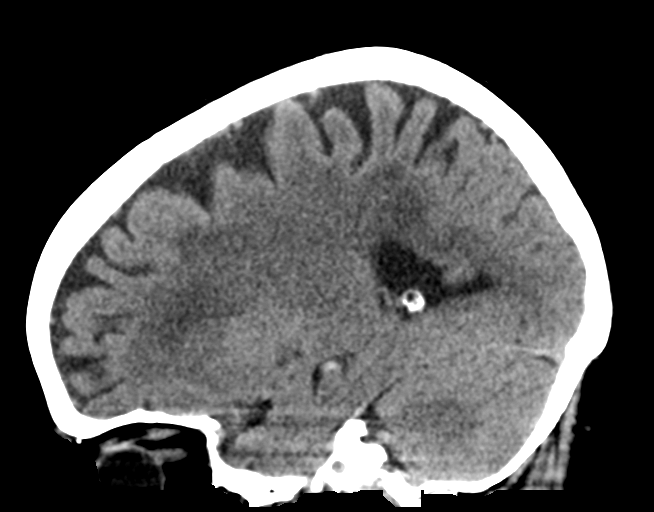

[16 of 47 positions shown; findings below may reference images not displayed]

FINDINGS: Brain: There is mild age-related atrophy and chronic microvascular
ischemic changes. There is no acute intracranial hemorrhage. No mass
effect or midline shift. No extra-axial fluid collection.

Vascular: No hyperdense vessel or unexpected calcification.

Skull: Normal. Negative for fracture or focal lesion.

Sinuses/Orbits: No acute finding.

Other: None
IMPRESSION: 1. No acute intracranial pathology.
2. Mild age-related atrophy and chronic microvascular ischemic
changes.

## 2021-01-02 ENCOUNTER — Other Ambulatory Visit: Payer: Self-pay

## 2021-01-02 ENCOUNTER — Ambulatory Visit: Payer: Medicare HMO | Admitting: Cardiology

## 2021-01-02 ENCOUNTER — Encounter: Payer: Self-pay | Admitting: Cardiology

## 2021-01-02 VITALS — BP 128/66 | HR 66 | Ht 61.5 in | Wt 143.2 lb

## 2021-01-02 DIAGNOSIS — R002 Palpitations: Secondary | ICD-10-CM | POA: Diagnosis not present

## 2021-01-02 DIAGNOSIS — I10 Essential (primary) hypertension: Secondary | ICD-10-CM

## 2021-01-02 NOTE — Addendum Note (Signed)
Addended by: Theresia Majors on: 01/02/2021 02:09 PM   Modules accepted: Orders

## 2021-01-02 NOTE — Progress Notes (Signed)
Cardiology Office Note    Date:  01/02/2021   ID:  Kelsey Elliott, DOB 14-Nov-1946, MRN [id number]  PCP:  Merri Brunette, MD  Cardiologist:  Armanda Magic, MD   Chief Complaint  Patient presents with  . New Patient (Initial Visit)    Palpitations    History of Present Illness:  Kelsey Elliott is a 75 y.o. female who is being seen today for the evaluation of palpitations at the request of Merri Brunette, MD.  This is a 75yo female with a hx of HTN, HLD and fatty liver who is referred for evaluation of palpitations She tells me that her health had been great until the past year when her BP started increasing.  In November she had an episode of heart fluttering and went to the ER because of HTN and BP was 170/19mmHG and HR 100-104bpm.  EKG showed NSR and possible old anterior infarct.  hsTrop was normal at 2>2.  TSH was normal through her PCP.  All labs were normal.  She was started on Losartan in Dec for HTN by her PCP.  She continued to have palpitations and was started on Toprol XL 50mg  daily.  She is now referred for further evaluation.    She denies any chest pain or pressure, SOB, DOE, PND, orthopnea, LE edema, dizziness or syncope.  Since going on Toprol her palpitations have improved.   She  is compliant with her meds and is tolerating meds with no SE.    Past Medical History:  Diagnosis Date  . Abdominal pain   . Allergic rhinitis   . Atrophic vaginitis   . Chest pain   . Eczema   . Elevated liver enzymes   . Fatigue   . Fatty liver   . Headache   . Hypercholesterolemia   . Hypertension   . Osteopenia   . Tachycardia     Past Surgical History:  Procedure Laterality Date  . ABDOMINAL HYSTERECTOMY    . SUSPENSIONPLASTY    . TOE REPAIR      Current Medications: Current Meds  Medication Sig  . Ascorbic Acid (VITAMIN C) 1000 MG tablet Take 1,000 mg by mouth daily.  aspirin EC 81 MG tablet Take 81 mg by mouth daily. Swallow whole.  . cholecalciferol (VITAMIN D3) 25 MCG  (1000 UNIT) tablet Take 1,000 Units by mouth daily.  Marland Kitchen denosumab (PROLIA) 60 MG/ML SOSY injection Inject 60 mg into the skin every 6 (six) months.  . EPINEPHrine 0.3 mg/0.3 mL IJ SOAJ injection Inject 0.3 mg into the muscle as needed for anaphylaxis.  Marland Kitchen ibuprofen (ADVIL) 200 MG tablet Take 400 mg by mouth every 6 (six) hours as needed for moderate pain.  Marland Kitchen LOSARTAN POTASSIUM PO Take 50 mg by mouth.  . metoprolol succinate (TOPROL-XL) 50 MG 24 hr tablet Take 50 mg by mouth daily. Take with or immediately following a meal.  . Multiple Vitamin (MULTIVITAMIN WITH MINERALS) TABS tablet Take 1 tablet by mouth daily.    Allergies:   Piroxicam, Compazine [prochlorperazine edisylate], Peroxidase, and Prochlorperazine   Social History   Socioeconomic History  . Marital status: Married    Spouse name: Not on file  . Number of children: Not on file  . Years of education: Not on file  . Highest education level: Not on file  Occupational History  . Not on file  Tobacco Use  . Smoking status: Former Marland Kitchen  . Smokeless tobacco: Never Used  Substance and Sexual Activity  .  Alcohol use: Never  . Drug use: Never  . Sexual activity: Not on file  Other Topics Concern  . Not on file  Social History Narrative  . Not on file   Social Determinants of Health   Financial Resource Strain: Not on file  Food Insecurity: Not on file  Transportation Needs: Not on file  Physical Activity: Not on file  Stress: Not on file  Social Connections: Not on file     Family History:  The patient's family history includes Cancer in her mother; Healthy in her daughter and sister; Heart failure in her father; Kidney disease in her father; Obesity in her brother; Osteoarthritis in her sister.   ROS:   Please see the history of present illness.    ROS All other systems reviewed and are negative.  No flowsheet data found.     PHYSICAL EXAM:   VS:  BP 128/66   Pulse 66   Ht 5' 1.5" (1.562 m)   Wt 143 lb 3.2  oz (65 kg)   SpO2 97%   BMI 26.62 kg/m    GEN: Well nourished, well developed, in no acute distress  HEENT: normal  Neck: no JVD, carotid bruits, or masses Cardiac: RRR; no murmurs, rubs, or gallops,no edema.  Intact distal pulses bilaterally.  Respiratory:  clear to auscultation bilaterally, normal work of breathing GI: soft, nontender, nondistended, + BS MS: no deformity or atrophy  Skin: warm and dry, no rash Neuro:  Alert and Oriented x 3, Strength and sensation are intact Psych: euthymic mood, full affect  Wt Readings from Last 3 Encounters:  01/02/21 143 lb 3.2 oz (65 kg)  05/25/16 160 lb (72.6 kg)  08/12/12 160 lb (72.6 kg)      Studies/Labs Reviewed:   EKG:  EKG is not ordered today.    Recent Labs: 10/23/2020: ALT 19; BUN 12; Creatinine, Ser 0.60; Hemoglobin 14.0; Platelets 204; Potassium 3.9; Sodium 142   Lipid Panel No results found for: CHOL, TRIG, HDL, CHOLHDL, VLDL, LDLCALC, LDLDIRECT  Additional studies/ records that were reviewed today include:  none    ASSESSMENT:    1. Palpitations   2. Primary hypertension      PLAN:  In order of problems listed above:  Palpitations -EKG was normal in ER -her palpitations have settled down since going on Toprol and has not had any further fast HR -She drinks 2-3 cups of coffee in the am -encouraged her to cut back on caffeine and ETOH -check 2D echo to assess LVF -event monitor to assess for atrial arrhythmias such as PAF  HTN -this is a recent dx -BP is controlled on Losartan 50mg  daily and Toprol XL 50mg  daily -TSH was normal when done by PCP    Medication Adjustments/Labs and Tests Ordered: Current medicines are reviewed at length with the patient today.  Concerns regarding medicines are outlined above.  Medication changes, Labs and Tests ordered today are listed in the Patient Instructions below.  There are no Patient Instructions on file for this visit.   Signed, , MD  01/02/2021  2:00 PM    [facility] [address] [place], [place], [place]  [place] Phone: [phone]; Fax: [phone]

## 2021-01-02 NOTE — Patient Instructions (Signed)
Medication Instructions:  Your physician recommends that you continue on your current medications as directed. Please refer to the Current Medication list given to you today.  *If you need a refill on your cardiac medications before your next appointment, please call your pharmacy*   Testing/Procedures: Your physician has requested that you have an echocardiogram. Echocardiography is a painless test that uses sound waves to create images of your heart. It provides your doctor with information about the size and shape of your heart and how well your heart's chambers and valves are working. This procedure takes approximately one hour. There are no restrictions for this procedure.  Your physician has recommended that you wear an event monitor. Event monitors are medical devices that record the heart's electrical activity. Doctors most often us these monitors to diagnose arrhythmias. Arrhythmias are problems with the speed or rhythm of the heartbeat. The monitor is a small, portable device. You can wear one while you do your normal daily activities. This is usually used to diagnose what is causing palpitations/syncope (passing out).   Follow-Up: At [facility], you and your health needs are our priority.  As part of our continuing mission to provide you with exceptional heart care, we have created designated Provider Care Teams.  These Care Teams include your primary Cardiologist (physician) and Advanced Practice Providers (APPs -  Physician Assistants and Nurse Practitioners) who all work together to provide you with the care you need, when you need it.  Follow up with Dr. Turner as needed based on results of testing 

## 2021-01-08 ENCOUNTER — Ambulatory Visit (INDEPENDENT_AMBULATORY_CARE_PROVIDER_SITE_OTHER): Payer: Medicare HMO

## 2021-01-08 DIAGNOSIS — R002 Palpitations: Secondary | ICD-10-CM

## 2021-01-23 ENCOUNTER — Other Ambulatory Visit: Payer: Self-pay

## 2021-01-23 ENCOUNTER — Ambulatory Visit (HOSPITAL_COMMUNITY): Payer: Medicare HMO | Attending: Cardiology

## 2021-01-23 DIAGNOSIS — I358 Other nonrheumatic aortic valve disorders: Secondary | ICD-10-CM | POA: Diagnosis not present

## 2021-01-23 DIAGNOSIS — E785 Hyperlipidemia, unspecified: Secondary | ICD-10-CM | POA: Insufficient documentation

## 2021-01-23 DIAGNOSIS — R000 Tachycardia, unspecified: Secondary | ICD-10-CM | POA: Diagnosis not present

## 2021-01-23 DIAGNOSIS — R5383 Other fatigue: Secondary | ICD-10-CM | POA: Diagnosis not present

## 2021-01-23 DIAGNOSIS — R079 Chest pain, unspecified: Secondary | ICD-10-CM | POA: Diagnosis not present

## 2021-01-23 DIAGNOSIS — I10 Essential (primary) hypertension: Secondary | ICD-10-CM | POA: Insufficient documentation

## 2021-01-23 DIAGNOSIS — R002 Palpitations: Secondary | ICD-10-CM | POA: Insufficient documentation

## 2021-01-23 DIAGNOSIS — I340 Nonrheumatic mitral (valve) insufficiency: Secondary | ICD-10-CM

## 2021-01-23 LAB — ECHOCARDIOGRAM COMPLETE
Area-P 1/2: 4.23 cm2
S' Lateral: 2.1 cm

## 2021-01-25 ENCOUNTER — Encounter: Payer: Self-pay | Admitting: Cardiology

## 2021-01-25 DIAGNOSIS — I340 Nonrheumatic mitral (valve) insufficiency: Secondary | ICD-10-CM | POA: Insufficient documentation

## 2021-02-10 DIAGNOSIS — Z1231 Encounter for screening mammogram for malignant neoplasm of breast: Secondary | ICD-10-CM | POA: Diagnosis not present

## 2021-03-18 DIAGNOSIS — R7309 Other abnormal glucose: Secondary | ICD-10-CM | POA: Diagnosis not present

## 2021-03-18 DIAGNOSIS — E7800 Pure hypercholesterolemia, unspecified: Secondary | ICD-10-CM | POA: Diagnosis not present

## 2021-03-18 DIAGNOSIS — R946 Abnormal results of thyroid function studies: Secondary | ICD-10-CM | POA: Diagnosis not present

## 2021-03-18 DIAGNOSIS — I10 Essential (primary) hypertension: Secondary | ICD-10-CM | POA: Diagnosis not present

## 2021-03-19 ENCOUNTER — Other Ambulatory Visit: Payer: Self-pay

## 2021-03-19 ENCOUNTER — Ambulatory Visit: Payer: Medicare HMO | Attending: Internal Medicine

## 2021-03-19 DIAGNOSIS — Z23 Encounter for immunization: Secondary | ICD-10-CM

## 2021-03-19 NOTE — Progress Notes (Signed)
   Covid-19 Vaccination Clinic  Name:  Kelsey Elliott    MRN: [id number] DOB: [birth year]  03/19/2021  [name] was observed post Covid-19 immunization for 15 minutes without incident. She was provided with Vaccine Information Sheet and instruction to access the V-Safe system.   [name] was instructed to call 911 with any severe reactions post vaccine: Marland Kitchen Difficulty breathing  . Swelling of face and throat  . A fast heartbeat  . A bad rash all over body  . Dizziness and weakness   Immunizations Administered    Name Date Dose VIS Date Route   PFIZER Comrnaty(Gray TOP) Covid-19 Vaccine 03/19/2021  1:59 PM 0.3 mL 11/21/2020 Intramuscular   Manufacturer: [employer], [employer]   Lot: [id number]   NDC: [phone]

## 2021-03-25 DIAGNOSIS — Z0000 Encounter for general adult medical examination without abnormal findings: Secondary | ICD-10-CM | POA: Diagnosis not present

## 2021-03-25 DIAGNOSIS — R0683 Snoring: Secondary | ICD-10-CM | POA: Diagnosis not present

## 2021-03-25 DIAGNOSIS — E7800 Pure hypercholesterolemia, unspecified: Secondary | ICD-10-CM | POA: Diagnosis not present

## 2021-03-25 DIAGNOSIS — E559 Vitamin D deficiency, unspecified: Secondary | ICD-10-CM | POA: Diagnosis not present

## 2021-03-25 DIAGNOSIS — I10 Essential (primary) hypertension: Secondary | ICD-10-CM | POA: Diagnosis not present

## 2021-03-25 DIAGNOSIS — M810 Age-related osteoporosis without current pathological fracture: Secondary | ICD-10-CM | POA: Diagnosis not present

## 2021-03-25 DIAGNOSIS — R400 Somnolence: Secondary | ICD-10-CM | POA: Diagnosis not present

## 2021-03-27 ENCOUNTER — Other Ambulatory Visit (HOSPITAL_BASED_OUTPATIENT_CLINIC_OR_DEPARTMENT_OTHER): Payer: Self-pay

## 2021-03-27 MED ORDER — COVID-19 MRNA VACCINE (PFIZER) 30 MCG/0.3ML IM SUSP
INTRAMUSCULAR | 0 refills | Status: AC
  Filled 2021-03-27: qty 0.3, 1d supply, fill #0

## 2021-04-04 DIAGNOSIS — G4719 Other hypersomnia: Secondary | ICD-10-CM | POA: Diagnosis not present

## 2021-04-04 DIAGNOSIS — I10 Essential (primary) hypertension: Secondary | ICD-10-CM | POA: Diagnosis not present

## 2021-04-22 DIAGNOSIS — I10 Essential (primary) hypertension: Secondary | ICD-10-CM | POA: Diagnosis not present

## 2021-04-22 DIAGNOSIS — M2550 Pain in unspecified joint: Secondary | ICD-10-CM | POA: Diagnosis not present

## 2021-06-04 DIAGNOSIS — M810 Age-related osteoporosis without current pathological fracture: Secondary | ICD-10-CM | POA: Diagnosis not present

## 2021-06-13 DIAGNOSIS — Z961 Presence of intraocular lens: Secondary | ICD-10-CM | POA: Diagnosis not present

## 2021-06-13 DIAGNOSIS — H40013 Open angle with borderline findings, low risk, bilateral: Secondary | ICD-10-CM | POA: Diagnosis not present

## 2021-06-13 DIAGNOSIS — H524 Presbyopia: Secondary | ICD-10-CM | POA: Diagnosis not present

## 2021-07-02 DIAGNOSIS — J3089 Other allergic rhinitis: Secondary | ICD-10-CM | POA: Diagnosis not present

## 2021-07-02 DIAGNOSIS — J309 Allergic rhinitis, unspecified: Secondary | ICD-10-CM | POA: Diagnosis not present

## 2021-07-02 DIAGNOSIS — J301 Allergic rhinitis due to pollen: Secondary | ICD-10-CM | POA: Diagnosis not present

## 2021-07-07 DIAGNOSIS — G4733 Obstructive sleep apnea (adult) (pediatric): Secondary | ICD-10-CM | POA: Diagnosis not present

## 2021-08-07 DIAGNOSIS — G4733 Obstructive sleep apnea (adult) (pediatric): Secondary | ICD-10-CM | POA: Diagnosis not present

## 2021-08-14 DIAGNOSIS — G4733 Obstructive sleep apnea (adult) (pediatric): Secondary | ICD-10-CM | POA: Diagnosis not present

## 2021-09-07 DIAGNOSIS — G4733 Obstructive sleep apnea (adult) (pediatric): Secondary | ICD-10-CM | POA: Diagnosis not present

## 2021-09-10 ENCOUNTER — Other Ambulatory Visit (HOSPITAL_BASED_OUTPATIENT_CLINIC_OR_DEPARTMENT_OTHER): Payer: Self-pay

## 2021-09-10 ENCOUNTER — Ambulatory Visit: Payer: Medicare HMO | Attending: Internal Medicine

## 2021-09-10 DIAGNOSIS — Z23 Encounter for immunization: Secondary | ICD-10-CM

## 2021-09-10 MED ORDER — PFIZER COVID-19 VAC BIVALENT 30 MCG/0.3ML IM SUSP
INTRAMUSCULAR | 0 refills | Status: AC
  Filled 2021-09-10: qty 0.3, 1d supply, fill #0

## 2021-09-10 MED ORDER — INFLUENZA VAC A&B SA ADJ QUAD 0.5 ML IM PRSY
PREFILLED_SYRINGE | INTRAMUSCULAR | 0 refills | Status: AC
  Filled 2021-09-10: qty 0.5, 1d supply, fill #0

## 2021-09-10 NOTE — Progress Notes (Signed)
   Covid-19 Vaccination Clinic  Name:  Kelsey Elliott    MRN: [id number] DOB: 06/28/1946  09/10/2021  [name] was observed post Covid-19 immunization for 15 minutes without incident. She was provided with Vaccine Information Sheet and instruction to access the V-Safe system.   [name] was instructed to call 911 with any severe reactions post vaccine: Difficulty breathing  Swelling of face and throat  A fast heartbeat  A bad rash all over body  Dizziness and weakness

## 2021-10-07 DIAGNOSIS — G4733 Obstructive sleep apnea (adult) (pediatric): Secondary | ICD-10-CM | POA: Diagnosis not present

## 2021-10-08 DIAGNOSIS — G4733 Obstructive sleep apnea (adult) (pediatric): Secondary | ICD-10-CM | POA: Diagnosis not present

## 2021-10-27 DIAGNOSIS — I10 Essential (primary) hypertension: Secondary | ICD-10-CM | POA: Diagnosis not present

## 2021-10-27 DIAGNOSIS — G4733 Obstructive sleep apnea (adult) (pediatric): Secondary | ICD-10-CM | POA: Diagnosis not present

## 2021-11-07 DIAGNOSIS — G4733 Obstructive sleep apnea (adult) (pediatric): Secondary | ICD-10-CM | POA: Diagnosis not present

## 2021-12-07 DIAGNOSIS — G4733 Obstructive sleep apnea (adult) (pediatric): Secondary | ICD-10-CM | POA: Diagnosis not present

## 2021-12-09 DIAGNOSIS — H903 Sensorineural hearing loss, bilateral: Secondary | ICD-10-CM | POA: Diagnosis not present

## 2021-12-09 DIAGNOSIS — H6121 Impacted cerumen, right ear: Secondary | ICD-10-CM | POA: Diagnosis not present

## 2021-12-11 DIAGNOSIS — M810 Age-related osteoporosis without current pathological fracture: Secondary | ICD-10-CM | POA: Diagnosis not present

## 2022-01-07 DIAGNOSIS — G4733 Obstructive sleep apnea (adult) (pediatric): Secondary | ICD-10-CM | POA: Diagnosis not present

## 2022-01-09 DIAGNOSIS — G4733 Obstructive sleep apnea (adult) (pediatric): Secondary | ICD-10-CM | POA: Diagnosis not present

## 2022-02-07 DIAGNOSIS — G4733 Obstructive sleep apnea (adult) (pediatric): Secondary | ICD-10-CM | POA: Diagnosis not present

## 2022-02-16 DIAGNOSIS — Z1231 Encounter for screening mammogram for malignant neoplasm of breast: Secondary | ICD-10-CM | POA: Diagnosis not present

## 2022-03-07 DIAGNOSIS — G4733 Obstructive sleep apnea (adult) (pediatric): Secondary | ICD-10-CM | POA: Diagnosis not present

## 2022-03-10 DIAGNOSIS — M5430 Sciatica, unspecified side: Secondary | ICD-10-CM | POA: Diagnosis not present

## 2022-03-10 DIAGNOSIS — M25552 Pain in left hip: Secondary | ICD-10-CM | POA: Insufficient documentation

## 2022-03-13 DIAGNOSIS — M5416 Radiculopathy, lumbar region: Secondary | ICD-10-CM | POA: Diagnosis not present

## 2022-03-17 DIAGNOSIS — M5416 Radiculopathy, lumbar region: Secondary | ICD-10-CM | POA: Diagnosis not present

## 2022-03-23 DIAGNOSIS — R7309 Other abnormal glucose: Secondary | ICD-10-CM | POA: Diagnosis not present

## 2022-03-23 DIAGNOSIS — R946 Abnormal results of thyroid function studies: Secondary | ICD-10-CM | POA: Diagnosis not present

## 2022-03-23 DIAGNOSIS — E7800 Pure hypercholesterolemia, unspecified: Secondary | ICD-10-CM | POA: Diagnosis not present

## 2022-03-23 DIAGNOSIS — M8580 Other specified disorders of bone density and structure, unspecified site: Secondary | ICD-10-CM | POA: Diagnosis not present

## 2022-03-23 DIAGNOSIS — E559 Vitamin D deficiency, unspecified: Secondary | ICD-10-CM | POA: Diagnosis not present

## 2022-03-23 DIAGNOSIS — I10 Essential (primary) hypertension: Secondary | ICD-10-CM | POA: Diagnosis not present

## 2022-03-27 DIAGNOSIS — M5416 Radiculopathy, lumbar region: Secondary | ICD-10-CM | POA: Diagnosis not present

## 2022-03-30 DIAGNOSIS — M159 Polyosteoarthritis, unspecified: Secondary | ICD-10-CM | POA: Diagnosis not present

## 2022-03-30 DIAGNOSIS — I10 Essential (primary) hypertension: Secondary | ICD-10-CM | POA: Diagnosis not present

## 2022-03-30 DIAGNOSIS — Z0000 Encounter for general adult medical examination without abnormal findings: Secondary | ICD-10-CM | POA: Diagnosis not present

## 2022-03-30 DIAGNOSIS — M810 Age-related osteoporosis without current pathological fracture: Secondary | ICD-10-CM | POA: Diagnosis not present

## 2022-03-30 DIAGNOSIS — E7800 Pure hypercholesterolemia, unspecified: Secondary | ICD-10-CM | POA: Diagnosis not present

## 2022-03-31 ENCOUNTER — Other Ambulatory Visit: Payer: Self-pay | Admitting: Registered Nurse

## 2022-03-31 DIAGNOSIS — E7800 Pure hypercholesterolemia, unspecified: Secondary | ICD-10-CM

## 2022-04-07 DIAGNOSIS — G4733 Obstructive sleep apnea (adult) (pediatric): Secondary | ICD-10-CM | POA: Diagnosis not present

## 2022-04-08 DIAGNOSIS — M5416 Radiculopathy, lumbar region: Secondary | ICD-10-CM | POA: Diagnosis not present

## 2022-04-10 DIAGNOSIS — G4733 Obstructive sleep apnea (adult) (pediatric): Secondary | ICD-10-CM | POA: Diagnosis not present

## 2022-04-13 DIAGNOSIS — M5416 Radiculopathy, lumbar region: Secondary | ICD-10-CM | POA: Diagnosis not present

## 2022-04-20 DIAGNOSIS — M5416 Radiculopathy, lumbar region: Secondary | ICD-10-CM | POA: Diagnosis not present

## 2022-04-29 ENCOUNTER — Ambulatory Visit
Admission: RE | Admit: 2022-04-29 | Discharge: 2022-04-29 | Disposition: A | Discharge status: Home / Self Care | Payer: No Typology Code available for payment source | Source: Ambulatory Visit | Attending: Registered Nurse | Admitting: Registered Nurse

## 2022-04-29 DIAGNOSIS — E7800 Pure hypercholesterolemia, unspecified: Secondary | ICD-10-CM

## 2022-05-07 DIAGNOSIS — G4733 Obstructive sleep apnea (adult) (pediatric): Secondary | ICD-10-CM | POA: Diagnosis not present

## 2022-05-12 DIAGNOSIS — J849 Interstitial pulmonary disease, unspecified: Secondary | ICD-10-CM | POA: Diagnosis not present

## 2022-05-12 DIAGNOSIS — I2510 Atherosclerotic heart disease of native coronary artery without angina pectoris: Secondary | ICD-10-CM | POA: Diagnosis not present

## 2022-05-12 DIAGNOSIS — I700 Atherosclerosis of aorta: Secondary | ICD-10-CM | POA: Diagnosis not present

## 2022-05-12 DIAGNOSIS — E7800 Pure hypercholesterolemia, unspecified: Secondary | ICD-10-CM | POA: Diagnosis not present

## 2022-06-07 DIAGNOSIS — G4733 Obstructive sleep apnea (adult) (pediatric): Secondary | ICD-10-CM | POA: Diagnosis not present

## 2022-06-09 ENCOUNTER — Encounter: Payer: Self-pay | Admitting: Pulmonary Disease

## 2022-06-09 ENCOUNTER — Ambulatory Visit: Payer: Medicare HMO | Admitting: Pulmonary Disease

## 2022-06-09 VITALS — BP 132/68 | HR 74 | Temp 98.4°F | Ht 61.0 in | Wt 149.0 lb

## 2022-06-09 DIAGNOSIS — J849 Interstitial pulmonary disease, unspecified: Secondary | ICD-10-CM

## 2022-06-09 NOTE — Progress Notes (Signed)
Kelsey Elliott    [id number]    [birth year]  Primary Care Physician:Pharr, Zollie Beckers, MD  Referring Physician: Fatima Sanger, FNP [address] [place],  [place] [address]  Chief complaint: Consult for interstitial lung disease  HPI: 76 y.o. with history of hypertension, hyperlipidemia, allergies, sleep apnea.  She has been referred for evaluation of abnormal cardiac CT scan showing interstitial lung disease. She has seasonal allergies, obstructive sleep apnea on CPAP for many years without issue.  No complaints pertaining to the lung.  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  No mold, hot tub, Jacuzzi ILD questionnaire 06/11/2022-down exposure as above.  All other exposures are negative Smoking history: 8-pack-year smoker.  Quit in 1973 Travel history: No significant travel history Relevant family history: No family history of lung disease   Outpatient Encounter Medications as of 06/09/2022  Medication Sig   Ascorbic Acid (VITAMIN C) 1000 MG tablet Take 1,000 mg by mouth daily.   aspirin EC 81 MG tablet Take 81 mg by mouth daily. Swallow whole.   cholecalciferol (VITAMIN D3) 25 MCG (1000 UNIT) tablet Take 1,000 Units by mouth daily.   COVID-19 mRNA bivalent vaccine, Pfizer, (PFIZER COVID-19 VAC BIVALENT) injection Inject into the muscle.   COVID-19 mRNA vaccine, Pfizer, 30 MCG/0.3ML injection Inject into the muscle.   denosumab (PROLIA) 60 MG/ML SOSY injection Inject 60 mg into the skin every 6 (six) months.   EPINEPHrine 0.3 mg/0.3 mL IJ SOAJ injection Inject 0.3 mg into the muscle as needed for anaphylaxis.   ibuprofen (ADVIL) 200 MG tablet Take 400 mg by mouth every 6 (six) hours as needed for moderate pain.   influenza vaccine adjuvanted (FLUAD) 0.5 ML injection Inject into the muscle.   LOSARTAN POTASSIUM PO Take 50 mg by mouth.    metoprolol succinate (TOPROL-XL) 50 MG 24 hr tablet Take 50 mg by mouth daily. Take with or immediately following a meal.   Multiple Vitamin (MULTIVITAMIN WITH MINERALS) TABS tablet Take 1 tablet by mouth daily.   No facility-administered encounter medications on file as of 06/09/2022.    Allergies as of 06/09/2022 - Review Complete 06/09/2022  Allergen Reaction Noted   Piroxicam Other (See Comments) 10/23/2020   Compazine [prochlorperazine edisylate] Other (See Comments) 07/12/2012   Peroxidase Other (See Comments) 07/12/2012   Prochlorperazine Other (See Comments) 12/02/2020    Past Medical History:  Diagnosis Date   Abdominal pain    Allergic rhinitis    Atrophic vaginitis    Chest pain    Eczema    Elevated liver enzymes    Fatigue    Fatty liver    Headache    Hypercholesterolemia    Hypertension    Mitral regurgitation    mild by echo 01/2021   Osteopenia    Tachycardia     Past Surgical History:  Procedure Laterality Date   ABDOMINAL HYSTERECTOMY     SUSPENSIONPLASTY     TOE REPAIR      Family History  Problem Relation Age of Onset   Cancer Mother        BREAST AND OVARIAN   Heart failure Father    Kidney disease Father    Osteoarthritis Sister    Obesity Brother    Healthy Sister    Healthy Daughter     Social History   Socioeconomic History  Marital status: Married    Spouse name: Not on file   Number of children: Not on file   Years of education: Not on file   Highest education level: Not on file  Occupational History   Not on file  Tobacco Use   Smoking status: Former    Packs/day: 1.00    Types: Cigarettes    Quit date: 1971    Years since quitting: 52.5    Passive exposure: Never   Smokeless tobacco: Never  Substance and Sexual Activity   Alcohol use: Never   Drug use: Never   Sexual activity: Not on file  Other Topics Concern   Not on file  Social History Narrative   Not on file   Social Determinants of Health   Financial  Resource Strain: Not on file  Food Insecurity: Not on file  Transportation Needs: Not on file  Physical Activity: Not on file  Stress: Not on file  Social Connections: Not on file  Intimate Partner Violence: Not on file    Review of systems: Review of Systems  Constitutional: Negative for fever and chills.  HENT: Negative.   Eyes: Negative for blurred vision.  Respiratory: as per HPI  Cardiovascular: Negative for chest pain and palpitations.  Gastrointestinal: Negative for vomiting, diarrhea, blood per rectum. Genitourinary: Negative for dysuria, urgency, frequency and hematuria.  Musculoskeletal: Negative for myalgias, back pain and joint pain.  Skin: Negative for itching and rash.  Neurological: Negative for dizziness, tremors, focal weakness, seizures and loss of consciousness.  Endo/Heme/Allergies: Negative for environmental allergies.  Psychiatric/Behavioral: Negative for depression, suicidal ideas and hallucinations.  All other systems reviewed and are negative.  Physical Exam: Blood pressure 132/68, pulse 74, temperature 98.4 F (36.9 C), temperature source Oral, height 5\' 1"  (1.549 m), weight 149 lb (67.6 kg), SpO2 97 %. Gen:      No acute distress HEENT:  EOMI, sclera anicteric Neck:     No masses; no thyromegaly Lungs:    Clear to auscultation bilaterally; normal respiratory effort CV:         Regular rate and rhythm; no murmurs Abd:      + bowel sounds; soft, non-tender; no palpable masses, no distension Ext:    No edema; adequate peripheral perfusion Skin:      Warm and dry; no rash Neuro: alert and oriented x 3 Psych: normal mood and affect  Data Reviewed: Imaging: Cardiac CT 04/29/2022-visualized lungs show groundglass opacities, thickening of the peribronchovascular interstitium I reviewed images personally  PFTs:  Labs:  Assessment:  Interstitial lung disease Review of the scan shows some vague interstitial changes suggestive of underlying interstitial  lung disease.  She will need a high-resolution CT for better evaluation and determination of pattern Schedule pulmonary function test Get some baseline serologies including hypersensitivity panel  We discussed her use of down comforter and possibility that this could be causing the lung issue.  She is very fond of this and does not want to get rid of it just yet.  We will review the CT and labs and discuss again on return visit   Plan/Recommendations: High-res CT, PFTs CTD serologies  05/01/2022 MD [facility] 06/09/2022, 2:45 PM  CC: 06/11/2022, FNP

## 2022-06-10 LAB — CK: Total CK: 38 U/L (ref 7–177)

## 2022-06-11 ENCOUNTER — Encounter: Payer: Self-pay | Admitting: Pulmonary Disease

## 2022-06-11 LAB — ANTI-NUCLEAR AB-TITER (ANA TITER): ANA Titer 1: 1:40 {titer} — ABNORMAL HIGH

## 2022-06-11 LAB — ANA: Anti Nuclear Antibody (ANA): POSITIVE — AB

## 2022-06-11 LAB — RHEUMATOID FACTOR: Rheumatoid fact SerPl-aCnc: <14 IU/mL (ref <14)

## 2022-06-11 LAB — SJOGREN'S SYNDROME ANTIBODS(SSA + SSB)
SSA (Ro) (ENA) Antibody, IgG: <1 AI
SSB (La) (ENA) Antibody, IgG: 2.6 AI — AB

## 2022-06-11 LAB — ANTI-DNA ANTIBODY, DOUBLE-STRANDED: ds DNA Ab: <1 IU/mL

## 2022-06-11 LAB — ANTI-SCLERODERMA ANTIBODY: Scleroderma (Scl-70) (ENA) Antibody, IgG: <1 AI

## 2022-06-11 LAB — ALDOLASE: Aldolase: 3.9 U/L (ref <=8.1)

## 2022-06-11 LAB — CYCLIC CITRUL PEPTIDE ANTIBODY, IGG: Cyclic Citrullin Peptide Ab: <16 UNITS

## 2022-06-15 DIAGNOSIS — M810 Age-related osteoporosis without current pathological fracture: Secondary | ICD-10-CM | POA: Diagnosis not present

## 2022-06-15 LAB — HYPERSENSITIVITY PNEUMONITIS
A. Pullulans Abs: NEGATIVE
A.Fumigatus #1 Abs: NEGATIVE
Micropolyspora faeni, IgG: NEGATIVE
Pigeon Serum Abs: NEGATIVE
Thermoact. Saccharii: NEGATIVE
Thermoactinomyces vulgaris, IgG: NEGATIVE

## 2022-06-15 LAB — RNP ANTIBODIES: ENA RNP Ab: <0.2 AI (ref 0.0–0.9)

## 2022-06-20 LAB — MYOSITIS ASSESSR PLUS JO-1 AUTOABS
EJ Autoabs: NOT DETECTED
Jo-1 Autoabs: <1 AI
Ku Autoabs: NOT DETECTED
Mi-2 Autoabs: NOT DETECTED
OJ Autoabs: NOT DETECTED
PL-12 Autoabs: NOT DETECTED
PL-7 Autoabs: NOT DETECTED
SRP Autoabs: NOT DETECTED

## 2022-06-22 DIAGNOSIS — G4733 Obstructive sleep apnea (adult) (pediatric): Secondary | ICD-10-CM | POA: Diagnosis not present

## 2022-06-24 DIAGNOSIS — H524 Presbyopia: Secondary | ICD-10-CM | POA: Diagnosis not present

## 2022-06-24 DIAGNOSIS — H40013 Open angle with borderline findings, low risk, bilateral: Secondary | ICD-10-CM | POA: Diagnosis not present

## 2022-07-02 LAB — MYOMARKER 3 PLUS PROFILE (RDL)

## 2022-07-08 ENCOUNTER — Ambulatory Visit
Admission: RE | Admit: 2022-07-08 | Discharge: 2022-07-08 | Disposition: A | Discharge status: Home / Self Care | Payer: Medicare HMO | Source: Ambulatory Visit | Attending: Pulmonary Disease | Admitting: Pulmonary Disease

## 2022-07-08 DIAGNOSIS — I700 Atherosclerosis of aorta: Secondary | ICD-10-CM | POA: Diagnosis not present

## 2022-07-08 DIAGNOSIS — R918 Other nonspecific abnormal finding of lung field: Secondary | ICD-10-CM | POA: Diagnosis not present

## 2022-07-08 DIAGNOSIS — J849 Interstitial pulmonary disease, unspecified: Secondary | ICD-10-CM

## 2022-07-08 DIAGNOSIS — J84112 Idiopathic pulmonary fibrosis: Secondary | ICD-10-CM | POA: Diagnosis not present

## 2022-07-08 DIAGNOSIS — I2510 Atherosclerotic heart disease of native coronary artery without angina pectoris: Secondary | ICD-10-CM | POA: Diagnosis not present

## 2022-07-13 DIAGNOSIS — G4733 Obstructive sleep apnea (adult) (pediatric): Secondary | ICD-10-CM | POA: Diagnosis not present

## 2022-07-16 DIAGNOSIS — G4733 Obstructive sleep apnea (adult) (pediatric): Secondary | ICD-10-CM | POA: Diagnosis not present

## 2022-08-12 ENCOUNTER — Encounter: Payer: Self-pay | Admitting: Pulmonary Disease

## 2022-08-12 ENCOUNTER — Ambulatory Visit (INDEPENDENT_AMBULATORY_CARE_PROVIDER_SITE_OTHER): Payer: Medicare HMO | Admitting: Pulmonary Disease

## 2022-08-12 ENCOUNTER — Ambulatory Visit: Payer: Medicare HMO | Admitting: Pulmonary Disease

## 2022-08-12 VITALS — BP 136/68 | HR 68 | Temp 97.9°F | Ht 61.0 in | Wt 151.4 lb

## 2022-08-12 DIAGNOSIS — J849 Interstitial pulmonary disease, unspecified: Secondary | ICD-10-CM | POA: Diagnosis not present

## 2022-08-12 DIAGNOSIS — R768 Other specified abnormal immunological findings in serum: Secondary | ICD-10-CM | POA: Diagnosis not present

## 2022-08-12 LAB — PULMONARY FUNCTION TEST
DL/VA % pred: 96 %
DL/VA: 4.02 ml/min/mmHg/L
DLCO cor % pred: 86 %
DLCO cor: 14.92 ml/min/mmHg
DLCO unc % pred: 86 %
DLCO unc: 14.92 ml/min/mmHg
FEF 25-75 Post: 2.05 L/sec
FEF 25-75 Pre: 1.96 L/sec
FEF2575-%Change-Post: 4 %
FEF2575-%Pred-Post: 141 %
FEF2575-%Pred-Pre: 135 %
FEV1-%Change-Post: 1 %
FEV1-%Pred-Post: 102 %
FEV1-%Pred-Pre: 100 %
FEV1-Post: 1.84 L
FEV1-Pre: 1.81 L
FEV1FVC-%Change-Post: 0 %
FEV1FVC-%Pred-Pre: 111 %
FEV6-%Change-Post: 1 %
FEV6-%Pred-Post: 96 %
FEV6-%Pred-Pre: 95 %
FEV6-Post: 2.2 L
FEV6-Pre: 2.18 L
FEV6FVC-%Pred-Post: 105 %
FEV6FVC-%Pred-Pre: 105 %
FVC-%Change-Post: 1 %
FVC-%Pred-Post: 91 %
FVC-%Pred-Pre: 90 %
FVC-Post: 2.2 L
FVC-Pre: 2.18 L
Post FEV1/FVC ratio: 84 %
Post FEV6/FVC ratio: 100 %
Pre FEV1/FVC ratio: 83 %
Pre FEV6/FVC Ratio: 100 %
RV % pred: 67 %
RV: 1.45 L
TLC % pred: 91 %
TLC: 4.2 L

## 2022-08-12 NOTE — Progress Notes (Signed)
PFT done today. 

## 2022-08-12 NOTE — Addendum Note (Signed)
Addended by: Jacquiline Doe on: 08/12/2022 02:08 PM   Modules accepted: Orders

## 2022-08-12 NOTE — Progress Notes (Signed)
Kelsey Elliott    [id number]    1946/02/20  Primary Care Physician:Pharr, Zollie Beckers, MD  Referring Physician: Merri Brunette, MD [address] [place],  [place] [address]  Chief complaint: Follow up for interstitial lung disease  HPI: 76 y.o. with history of hypertension, hyperlipidemia, allergies, sleep apnea.  She has been referred for evaluation of abnormal cardiac CT scan showing interstitial lung disease. She has seasonal allergies, obstructive sleep apnea on CPAP for many years without issue.  No complaints pertaining to the lung.  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  No mold, hot tub, Jacuzzi ILD questionnaire 06/11/2022-down exposure as above.  All other exposures are negative Smoking history: 8-pack-year smoker.  Quit in 1973 Travel history: No significant travel history Relevant family history: No family history of lung disease  Interim history: Here for review of CT and PFTs.  States that breathing is doing well  Outpatient Encounter Medications as of 08/12/2022  Medication Sig   Ascorbic Acid (VITAMIN C) 1000 MG tablet Take 1,000 mg by mouth daily.   cholecalciferol (VITAMIN D3) 25 MCG (1000 UNIT) tablet Take 1,000 Units by mouth daily.   denosumab (PROLIA) 60 MG/ML SOSY injection Inject 60 mg into the skin every 6 (six) months.   EPINEPHrine 0.3 mg/0.3 mL IJ SOAJ injection Inject 0.3 mg into the muscle as needed for anaphylaxis.   ibuprofen (ADVIL) 200 MG tablet Take 400 mg by mouth every 6 (six) hours as needed for moderate pain.   influenza vaccine adjuvanted (FLUAD) 0.5 ML injection Inject into the muscle.   LOSARTAN POTASSIUM PO Take 50 mg by mouth.   metoprolol succinate (TOPROL-XL) 50 MG 24 hr tablet Take 50 mg by mouth daily. Take with or immediately following a meal.   Multiple Vitamin (MULTIVITAMIN WITH MINERALS) TABS  tablet Take 1 tablet by mouth daily.   COVID-19 mRNA bivalent vaccine, Pfizer, (PFIZER COVID-19 VAC BIVALENT) injection Inject into the muscle.   COVID-19 mRNA vaccine, Pfizer, 30 MCG/0.3ML injection Inject into the muscle.   [DISCONTINUED] aspirin EC 81 MG tablet Take 81 mg by mouth daily. Swallow whole.   No facility-administered encounter medications on file as of 08/12/2022.   Physical Exam: Blood pressure 132/68, pulse 74, temperature 98.4 F (36.9 C), temperature source Oral, height 5\' 1"  (1.549 m), weight 149 lb (67.6 kg), SpO2 97 %. Gen:      No acute distress HEENT:  EOMI, sclera anicteric Neck:     No masses; no thyromegaly Lungs:    Clear to auscultation bilaterally; normal respiratory effort CV:         Regular rate and rhythm; no murmurs Abd:      + bowel sounds; soft, non-tender; no palpable masses, no distension Ext:    No edema; adequate peripheral perfusion Skin:      Warm and dry; no rash Neuro: alert and oriented x 3 Psych: normal mood and affect  Data Reviewed: Imaging:  Cardiac CT 04/29/2022-visualized lungs show groundglass opacities, thickening of the peribronchovascular interstitium  High-resolution CT 07/08/2022-mild basilar peripheral opacities with linear scarring.  Alternate diagnosis. I reviewed images personally  PFTs: 08/12/2022 FVC 2.30 [91%], FEV1 1.84 [102%], F/F 84, TLC 4.20 [91%], DLCO 14.92 [86%] Normal test  Labs: Autoimmune serologies 06/09/2022-positive only for ANA 1: 40 nuclear homogeneous, SSB 2.6 Hypersensitivity panel 06/09/2022-negative  Assessment:  Interstitial  lung disease Review of the scan shows some vague interstitial changes in a nonspecific pattern.  Findings are not really suggestive of interstitial lung disease and may be postinflammatory scarring though she denies any pneumonia or COVID infection.  Serologies are negative except for borderline ANA and positive SSB.  We will make referral to rheumatology for further  evaluation We discussed her use of down comforter and possibility that this could be causing the lung issue.  She is very fond of this and does not want to get rid of it just yet.    Plan/Recommendations: Rheumatology referral Follow-up in 6 months.  Chilton Greathouse MD [facility] 08/12/2022, 1:44 PM  CC: Merri Brunette, MD

## 2022-08-12 NOTE — Addendum Note (Signed)
Addended by: Jacquiline Doe on: 08/12/2022 02:04 PM   Modules accepted: Orders

## 2022-08-12 NOTE — Patient Instructions (Signed)
I am glad you are feeling well Your lung function tests are normal which is good news Your CT shows some mild scarring in the lower part of your lung.  This is nonspecific and in no particular pattern.  We will continue to monitor this We will make a referral to rheumatology for evaluation of elevated ANA and SSA, evaluate for Sjogren's syndrome Follow-up in clinic in 6 months

## 2022-08-18 DIAGNOSIS — G4733 Obstructive sleep apnea (adult) (pediatric): Secondary | ICD-10-CM | POA: Diagnosis not present

## 2022-08-29 NOTE — Progress Notes (Unsigned)
Office Visit Note  Patient: Kelsey Elliott             Date of Birth: 1946-09-04           MRN: [id number]             PCP: Deland Pretty, MD Referring: Marshell Garfinkel, MD Visit Date: 08/31/2022 Occupation: Retired Therapist, music  Subjective:  New Patient (Initial Visit) (Autoimmune markers present in lab results. )   History of Present Illness: Kelsey Elliott is a 76 y.o. female here for evaluation of positive ANA and SSB Abs in association with pulmonary symptoms and possible scarring or fibrosis lung changes.  She does not have significant respiratory symptoms and abnormal lung imaging findings were incidental on CT cardiac scoring study.  She does report fairly often having some coughing attributes this to upper airway drainage.  She denies any history of repeat infections or pneumonias.  She thinks her last case of the flu was over 30 years ago.  Laboratory testing in pulmonology clinic did show a low positive ANA 1:40 with positive SSB antibody. She has chronic joint pain in several areas with degenerative arthritis in the first [facility] joint of both hands and previous surgery on her left hand for this.  She also has some chronic back pains with degenerative arthritis changes.  She did a course of physical therapy for bilateral hip bursitis symptoms earlier this year with a good improvement although still has some mild pain in these areas.  She does not like taking medications and uses no over-the-counter medicines for joint pain usually. She denies any new skin rashes, discoloration in digits, joint swelling, or lymphadenopathy.  She tends to have chronic nasal congestion and often breathes more through her mouth sometimes causing dryness.  When she was using CPAP the nighttime air leak caused significant eye dryness but since switching to a mandibular advancement device is not having much trouble with this. Her daughter has psoriatic arthritis.  She denies any other known family history  of autoimmune diseases.  Labs reviewed 05/2022 ANA 1:40 homogenous SSB 2.6 dsDNA, Scl-70, SSA, RNP neg MyoMarker 3 panel neg RF neg CCP neg CK 38 Aldolase 3.9  Activities of Daily Living:  Patient reports morning stiffness for 10 minutes.   Patient Denies nocturnal pain.  Difficulty dressing/grooming: Denies Difficulty climbing stairs: Denies Difficulty getting out of chair: Denies Difficulty using hands for taps, buttons, cutlery, and/or writing: Reports  Review of Systems  Constitutional:  Positive for fatigue.  HENT:  Positive for mouth dryness. Negative for mouth sores.   Eyes:  Negative for dryness.  Respiratory:  Negative for shortness of breath.   Cardiovascular:  Negative for chest pain and palpitations.  Gastrointestinal:  Negative for blood in stool, constipation and diarrhea.  Endocrine: Negative for increased urination.  Genitourinary:  Negative for involuntary urination.  Musculoskeletal:  Positive for gait problem and morning stiffness. Negative for joint pain, joint pain, joint swelling, myalgias, muscle weakness, muscle tenderness and myalgias.  Skin:  Negative for color change, rash, hair loss and sensitivity to sunlight.  Allergic/Immunologic: Negative for susceptible to infections.  Neurological:  Negative for dizziness and headaches.  Hematological:  Negative for swollen glands.  Psychiatric/Behavioral:  Positive for sleep disturbance. Negative for depressed mood. The patient is nervous/anxious.     PMFS History:  Patient Active Problem List   Diagnosis Date Noted   Positive ANA (antinuclear antibody) 08/31/2022   Osteoarthritis 08/31/2022   Lumbar radiculopathy 03/27/2022  Pain of left hip joint 03/10/2022   Sciatica 03/10/2022   Mitral regurgitation    Elevated liver enzymes 02/16/2013   Encounter for long-term (current) use of medications 02/16/2013   Hypercholesterolemia 02/16/2013   Foot pain, bilateral 07/12/2012   Metatarsalgia of both  feet 07/12/2012    Past Medical History:  Diagnosis Date   Abdominal pain    Allergic rhinitis    Atrophic vaginitis    Chest pain    Eczema    Elevated liver enzymes    Fatigue    Fatty liver    Headache    Hypercholesterolemia    Hypertension    Mitral regurgitation    mild by echo 01/2021   Osteopenia    Tachycardia     Family History  Problem Relation Age of Onset   Cancer Mother        BREAST AND OVARIAN   Heart failure Father    Kidney disease Father    Osteoarthritis Sister    Obesity Brother    Autoimmune disease Daughter    Psoriasis Daughter    Past Surgical History:  Procedure Laterality Date   ABDOMINAL HYSTERECTOMY     GASTROC RECESSION EXTREMITY     SUSPENSIONPLASTY     TOE REPAIR     Social History   Social History Narrative   Not on file   Immunization History  Administered Date(s) Administered   PFIZER Comirnaty(Gray Top)Covid-19 Tri-Sucrose Vaccine 03/19/2021   PFIZER(Purple Top)SARS-COV-2 Vaccination 12/31/2019, 01/19/2020   Pfizer Covid-19 Vaccine Bivalent Booster 33yrs & up 09/10/2021     Objective: Vital Signs: BP 136/85 (BP Location: Right Arm, Patient Position: Sitting, Cuff Size: Normal)   Pulse 62   Resp 14   Ht 5\' 1"  (1.549 m)   Wt 152 lb 9.6 oz (69.2 kg)   BMI 28.83 kg/m    Physical Exam HENT:     Mouth/Throat:     Mouth: Mucous membranes are moist.     Pharynx: Oropharynx is clear.  Eyes:     Conjunctiva/sclera: Conjunctivae normal.  Cardiovascular:     Rate and Rhythm: Normal rate and regular rhythm.  Pulmonary:     Effort: Pulmonary effort is normal.     Breath sounds: Normal breath sounds.  Musculoskeletal:     Right lower leg: No edema.     Left lower leg: No edema.  Lymphadenopathy:     Cervical: No cervical adenopathy.  Skin:    General: Skin is warm and dry.     Findings: No rash.     Comments: Normal-appearing nailfold capillaroscopy  Neurological:     Mental Status: She is alert.  Psychiatric:         Mood and Affect: Mood normal.      Musculoskeletal Exam:  Shoulders full ROM no tenderness or swelling Elbows full ROM no tenderness or swelling Wrists full ROM no tenderness or swelling Fingers full ROM no tenderness or swelling, squaring of first CMC joint there is reversible 1st MCP subluxation and hyperextension Mild lumbar paraspinal muscle tenderness to pressure bilaterally Full internal and external rotation range of motion in both hips, very mild tenderness to pressure laterally Knees full ROM no tenderness or swelling Ankles full ROM no tenderness or swelling MTPs full ROM no tenderness or swelling mild lateral deviation of first MTP on both sides   Investigation: No additional findings.  Imaging: No results found.  Recent Labs: Lab Results  Component Value Date   WBC 6.4 10/23/2020   HGB  14.0 10/23/2020   PLT 204 10/23/2020   NA 142 10/23/2020   K 3.9 10/23/2020   CL 105 10/23/2020   CO2 27 10/23/2020   GLUCOSE 132 (H) 10/23/2020   BUN 12 10/23/2020   CREATININE 0.60 10/23/2020   BILITOT 0.6 10/23/2020   ALKPHOS 62 10/23/2020   AST 28 10/23/2020   ALT 19 10/23/2020   PROT 7.0 10/23/2020   ALBUMIN 4.2 10/23/2020   CALCIUM 9.4 10/23/2020    Speciality Comments: No specialty comments available.  Procedures:  No procedures performed Allergies: Piroxicam, Compazine [prochlorperazine edisylate], Peroxidase, and Prochlorperazine   Assessment / Plan:     Visit Diagnoses: Positive ANA (antinuclear antibody)  Low positive serology but no clinical criteria for Sjogren's syndrome or systemic lupus on exam or history.  I suspect this is most likely an incidental or false positive result.  Discussed that rarely people can develop additional symptoms in the future though I think it is much less likely but could follow-up as needed if developing more problems like severe eye and mouth dryness complications.  Primary osteoarthritis of both first carpometacarpal  joints  Known osteoarthritis in both thumbs already has a hand surgeon with previous treatment of one side.  I do not see any inflammatory joint changes associated with this. Hip range of motion is excellent so I deftly think this is more gluteal tendinopathy or bursitis rather than true joint disease and encouraged her to continue range of motion exercises.  Orders: No orders of the defined types were placed in this encounter.  No orders of the defined types were placed in this encounter.    Follow-Up Instructions: Return if symptoms worsen or fail to improve.   Fuller Plan, MD  Note - This record has been created using [employer].  Chart creation errors have been sought, but may not always  have been located. Such creation errors do not reflect on  the standard of medical care.

## 2022-08-31 ENCOUNTER — Encounter: Payer: Self-pay | Admitting: Internal Medicine

## 2022-08-31 ENCOUNTER — Ambulatory Visit: Payer: Medicare HMO | Attending: Internal Medicine | Admitting: Internal Medicine

## 2022-08-31 VITALS — BP 136/85 | HR 62 | Resp 14 | Ht 61.0 in | Wt 152.6 lb

## 2022-08-31 DIAGNOSIS — M180 Bilateral primary osteoarthritis of first carpometacarpal joints: Secondary | ICD-10-CM

## 2022-08-31 DIAGNOSIS — R768 Other specified abnormal immunological findings in serum: Secondary | ICD-10-CM

## 2022-08-31 DIAGNOSIS — M1990 Unspecified osteoarthritis, unspecified site: Secondary | ICD-10-CM | POA: Insufficient documentation

## 2022-09-04 ENCOUNTER — Other Ambulatory Visit (HOSPITAL_BASED_OUTPATIENT_CLINIC_OR_DEPARTMENT_OTHER): Payer: Self-pay

## 2022-09-04 MED ORDER — INFLUENZA VAC A&B SA ADJ QUAD 0.5 ML IM PRSY
PREFILLED_SYRINGE | INTRAMUSCULAR | 0 refills | Status: AC
  Filled 2022-09-04: qty 0.5, 1d supply, fill #0

## 2022-09-29 DIAGNOSIS — E559 Vitamin D deficiency, unspecified: Secondary | ICD-10-CM | POA: Diagnosis not present

## 2022-09-29 DIAGNOSIS — E7800 Pure hypercholesterolemia, unspecified: Secondary | ICD-10-CM | POA: Diagnosis not present

## 2022-09-29 DIAGNOSIS — I10 Essential (primary) hypertension: Secondary | ICD-10-CM | POA: Diagnosis not present

## 2022-09-29 DIAGNOSIS — J849 Interstitial pulmonary disease, unspecified: Secondary | ICD-10-CM | POA: Diagnosis not present

## 2022-09-29 DIAGNOSIS — I700 Atherosclerosis of aorta: Secondary | ICD-10-CM | POA: Diagnosis not present

## 2022-09-29 DIAGNOSIS — I2510 Atherosclerotic heart disease of native coronary artery without angina pectoris: Secondary | ICD-10-CM | POA: Diagnosis not present

## 2022-09-29 DIAGNOSIS — M810 Age-related osteoporosis without current pathological fracture: Secondary | ICD-10-CM | POA: Diagnosis not present

## 2022-10-01 DIAGNOSIS — H903 Sensorineural hearing loss, bilateral: Secondary | ICD-10-CM | POA: Diagnosis not present

## 2022-10-01 DIAGNOSIS — H6121 Impacted cerumen, right ear: Secondary | ICD-10-CM | POA: Diagnosis not present

## 2022-10-14 DIAGNOSIS — G4733 Obstructive sleep apnea (adult) (pediatric): Secondary | ICD-10-CM | POA: Diagnosis not present

## 2022-10-26 ENCOUNTER — Ambulatory Visit: Payer: Medicare HMO | Admitting: Internal Medicine

## 2022-12-30 DIAGNOSIS — M810 Age-related osteoporosis without current pathological fracture: Secondary | ICD-10-CM | POA: Diagnosis not present

## 2022-12-30 DIAGNOSIS — L309 Dermatitis, unspecified: Secondary | ICD-10-CM | POA: Diagnosis not present

## 2023-02-17 ENCOUNTER — Encounter: Payer: Self-pay | Admitting: Pulmonary Disease

## 2023-02-17 ENCOUNTER — Ambulatory Visit: Payer: Medicare HMO | Admitting: Pulmonary Disease

## 2023-02-17 VITALS — BP 132/70 | HR 74 | Temp 98.3°F | Ht 61.0 in | Wt 153.0 lb

## 2023-02-17 DIAGNOSIS — J849 Interstitial pulmonary disease, unspecified: Secondary | ICD-10-CM

## 2023-02-17 NOTE — Patient Instructions (Addendum)
I am glad you are doing well with your breathing Will order high-res CT in July 2024 for re evaluation of your lungs Follow-up in after CT scan in July 2024

## 2023-02-17 NOTE — Addendum Note (Signed)
Addended by: Elton Sin on: 02/17/2023 02:23 PM   Modules accepted: Orders

## 2023-02-17 NOTE — Progress Notes (Signed)
Kelsey Elliott    [id number]    04/24/1946  Primary Care Physician:Pharr, Thayer Jew, MD  Referring Physician: Deland Pretty, MD [address] [place],  [place] [address]  Chief complaint: Follow up for interstitial lung disease  HPI: 77 y.o. with history of hypertension, hyperlipidemia, allergies, sleep apnea.  She has been referred for evaluation of abnormal cardiac CT scan showing interstitial lung disease. She has seasonal allergies, obstructive sleep apnea on CPAP for many years without issue.  No complaints pertaining to the lung.  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  No mold, hot tub, Jacuzzi ILD questionnaire 06/11/2022-down exposure as above.  All other exposures are negative Smoking history: 8-pack-year smoker.  Quit in 1973 Travel history: No significant travel history Relevant family history: No family history of lung disease  Interim history: Breathing is doing well with no issues. Saw Dr. Benjamine Mola, rheumatology in September 2023 for elevated SSA and not felt to have any autoimmune disease  Outpatient Encounter Medications as of 02/17/2023  Medication Sig   Ascorbic Acid (VITAMIN C) 1000 MG tablet Take 1,000 mg by mouth daily.   cholecalciferol (VITAMIN D3) 25 MCG (1000 UNIT) tablet Take 1,000 Units by mouth daily.   denosumab (PROLIA) 60 MG/ML SOSY injection Inject 60 mg into the skin every 6 (six) months.   ibuprofen (ADVIL) 200 MG tablet Take 400 mg by mouth every 6 (six) hours as needed for moderate pain.   LOSARTAN POTASSIUM PO Take 50 mg by mouth.   metoprolol succinate (TOPROL-XL) 50 MG 24 hr tablet Take 50 mg by mouth daily. Take with or immediately following a meal.   Multiple Vitamin (MULTIVITAMIN WITH MINERALS) TABS tablet Take 1 tablet by mouth daily.   COVID-19 mRNA bivalent vaccine, Pfizer, (PFIZER COVID-19 VAC  BIVALENT) injection Inject into the muscle.   COVID-19 mRNA vaccine, Pfizer, 30 MCG/0.3ML injection Inject into the muscle.   influenza vaccine adjuvanted (FLUAD) 0.5 ML injection Inject into the muscle.   influenza vaccine adjuvanted (FLUAD) 0.5 ML injection Inject into the muscle.   [DISCONTINUED] EPINEPHrine 0.3 mg/0.3 mL IJ SOAJ injection Inject 0.3 mg into the muscle as needed for anaphylaxis. (Patient not taking: Reported on 08/31/2022)   No facility-administered encounter medications on file as of 02/17/2023.   Physical Exam: Blood pressure 132/70, pulse 74, temperature 98.3 F (36.8 C), temperature source Oral, height '5\' 1"'$  (1.549 m), weight 153 lb (69.4 kg), SpO2 97 %. Gen:      No acute distress HEENT:  EOMI, sclera anicteric Neck:     No masses; no thyromegaly Lungs:    Clear to auscultation bilaterally; normal respiratory effort CV:         Regular rate and rhythm; no murmurs Abd:      + bowel sounds; soft, non-tender; no palpable masses, no distension Ext:    No edema; adequate peripheral perfusion Skin:      Warm and dry; no rash Neuro: alert and oriented x 3 Psych: normal mood and affect   Data Reviewed: Imaging:  Cardiac CT 04/29/2022-visualized lungs show groundglass opacities, thickening of the peribronchovascular interstitium  High-resolution CT 07/08/2022-mild basilar peripheral opacities with linear scarring.  Alternate diagnosis. I reviewed images personally  PFTs: 08/12/2022 FVC 2.30 [91%], FEV1 1.84 [102%], F/F 84, TLC 4.20 [91%], DLCO 14.92 [86%] Normal test  Labs: Autoimmune serologies 06/09/2022-positive  only for ANA 1: 40 nuclear homogeneous, SSB 2.6 Hypersensitivity panel 06/09/2022-negative  Assessment:  Interstitial lung disease Review of the scan shows some vague interstitial changes in a nonspecific pattern.  Findings are not really suggestive of interstitial lung disease and may be postinflammatory scarring though she denies any pneumonia or COVID  infection.  Serologies are negative except for borderline ANA and positive SSB.  Finished rheumatology evaluation and not felt to have any autoimmune process We discussed her use of down comforter and possibility that this could be causing the lung issue.  She is very fond of this and does not want to get rid of it just yet.    Repeat high-res CT to reevaluate the lungs  Plan/Recommendations: Repeat high-res CT Follow-up in 4 months  Marshell Garfinkel MD  Pulmonary and Critical Care 02/17/2023, 2:09 PM  CC: Deland Pretty, MD

## 2023-02-22 DIAGNOSIS — Z1231 Encounter for screening mammogram for malignant neoplasm of breast: Secondary | ICD-10-CM | POA: Diagnosis not present

## 2023-03-29 DIAGNOSIS — M8580 Other specified disorders of bone density and structure, unspecified site: Secondary | ICD-10-CM | POA: Diagnosis not present

## 2023-03-29 DIAGNOSIS — E559 Vitamin D deficiency, unspecified: Secondary | ICD-10-CM | POA: Diagnosis not present

## 2023-03-29 DIAGNOSIS — I10 Essential (primary) hypertension: Secondary | ICD-10-CM | POA: Diagnosis not present

## 2023-03-29 DIAGNOSIS — R946 Abnormal results of thyroid function studies: Secondary | ICD-10-CM | POA: Diagnosis not present

## 2023-03-29 DIAGNOSIS — E7800 Pure hypercholesterolemia, unspecified: Secondary | ICD-10-CM | POA: Diagnosis not present

## 2023-04-05 DIAGNOSIS — I700 Atherosclerosis of aorta: Secondary | ICD-10-CM | POA: Diagnosis not present

## 2023-04-05 DIAGNOSIS — M810 Age-related osteoporosis without current pathological fracture: Secondary | ICD-10-CM | POA: Diagnosis not present

## 2023-04-05 DIAGNOSIS — E559 Vitamin D deficiency, unspecified: Secondary | ICD-10-CM | POA: Diagnosis not present

## 2023-04-05 DIAGNOSIS — I10 Essential (primary) hypertension: Secondary | ICD-10-CM | POA: Diagnosis not present

## 2023-04-05 DIAGNOSIS — Z0000 Encounter for general adult medical examination without abnormal findings: Secondary | ICD-10-CM | POA: Diagnosis not present

## 2023-04-05 DIAGNOSIS — J849 Interstitial pulmonary disease, unspecified: Secondary | ICD-10-CM | POA: Diagnosis not present

## 2023-04-05 DIAGNOSIS — I2510 Atherosclerotic heart disease of native coronary artery without angina pectoris: Secondary | ICD-10-CM | POA: Diagnosis not present

## 2023-07-01 DIAGNOSIS — M810 Age-related osteoporosis without current pathological fracture: Secondary | ICD-10-CM | POA: Diagnosis not present

## 2023-07-06 ENCOUNTER — Ambulatory Visit
Admission: RE | Admit: 2023-07-06 | Discharge: 2023-07-06 | Disposition: A | Discharge status: Home / Self Care | Payer: Medicare HMO | Source: Ambulatory Visit | Attending: Pulmonary Disease | Admitting: Pulmonary Disease

## 2023-07-06 DIAGNOSIS — R918 Other nonspecific abnormal finding of lung field: Secondary | ICD-10-CM | POA: Diagnosis not present

## 2023-07-06 DIAGNOSIS — J849 Interstitial pulmonary disease, unspecified: Secondary | ICD-10-CM

## 2023-07-06 DIAGNOSIS — K449 Diaphragmatic hernia without obstruction or gangrene: Secondary | ICD-10-CM | POA: Diagnosis not present

## 2023-07-06 DIAGNOSIS — J479 Bronchiectasis, uncomplicated: Secondary | ICD-10-CM | POA: Diagnosis not present

## 2023-07-06 DIAGNOSIS — I700 Atherosclerosis of aorta: Secondary | ICD-10-CM | POA: Diagnosis not present

## 2023-07-28 ENCOUNTER — Ambulatory Visit: Payer: Medicare HMO | Admitting: Pulmonary Disease

## 2023-07-28 ENCOUNTER — Encounter: Payer: Self-pay | Admitting: Pulmonary Disease

## 2023-07-28 VITALS — BP 120/86 | HR 78 | Temp 97.8°F | Ht 61.0 in | Wt 161.6 lb

## 2023-07-28 DIAGNOSIS — J849 Interstitial pulmonary disease, unspecified: Secondary | ICD-10-CM

## 2023-07-28 NOTE — Patient Instructions (Addendum)
Your CT looks good which is good news Will order a follow-up CT in 12 months Return to clinic in 12 months after CT scan

## 2023-07-28 NOTE — Progress Notes (Signed)
Kelsey Elliott    [id number]    Dec 19, 1945  Primary Care Physician:Pharr, Zollie Beckers, MD  Referring Physician: Merri Brunette, MD [address] [place],  [place] [address]  Chief complaint: Follow up for interstitial lung disease  HPI: 77 y.o. with history of hypertension, hyperlipidemia, allergies, sleep apnea.  She has been referred for evaluation of abnormal cardiac CT scan showing interstitial lung disease. She has seasonal allergies, obstructive sleep apnea on CPAP for many years without issue.  No complaints pertaining to the lung.  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  No mold, hot tub, Jacuzzi ILD questionnaire 06/11/2022-down exposure as above.  All other exposures are negative Smoking history: 8-pack-year smoker.  Quit in 1973 Travel history: No significant travel history Relevant family history: No family history of lung disease  Interim history: Breathing is doing well with no issues. Saw Dr. Dimple Casey, rheumatology in September 2023 for elevated SSA and not felt to have any autoimmune disease  She is here for review of CT scan.  Outpatient Encounter Medications as of 07/28/2023  Medication Sig   Ascorbic Acid (VITAMIN C) 1000 MG tablet Take 1,000 mg by mouth daily.   cholecalciferol (VITAMIN D3) 25 MCG (1000 UNIT) tablet Take 1,000 Units by mouth daily.   COVID-19 mRNA bivalent vaccine, Pfizer, (PFIZER COVID-19 VAC BIVALENT) injection Inject into the muscle.   COVID-19 mRNA vaccine, Pfizer, 30 MCG/0.3ML injection Inject into the muscle.   denosumab (PROLIA) 60 MG/ML SOSY injection Inject 60 mg into the skin every 6 (six) months.   ibuprofen (ADVIL) 200 MG tablet Take 400 mg by mouth every 6 (six) hours as needed for moderate pain.   influenza vaccine adjuvanted (FLUAD) 0.5 ML injection Inject into the muscle.   influenza vaccine  adjuvanted (FLUAD) 0.5 ML injection Inject into the muscle.   LOSARTAN POTASSIUM PO Take 50 mg by mouth.   metoprolol succinate (TOPROL-XL) 50 MG 24 hr tablet Take 50 mg by mouth daily. Take with or immediately following a meal.   Multiple Vitamin (MULTIVITAMIN WITH MINERALS) TABS tablet Take 1 tablet by mouth daily.   No facility-administered encounter medications on file as of 07/28/2023.   Physical Exam: Blood pressure 132/70, pulse 74, temperature 98.3 F (36.8 C), temperature source Oral, height 5\' 1"  (1.549 m), weight 153 lb (69.4 kg), SpO2 97 %. Gen:      No acute distress HEENT:  EOMI, sclera anicteric Neck:     No masses; no thyromegaly Lungs:    Clear to auscultation bilaterally; normal respiratory effort CV:         Regular rate and rhythm; no murmurs Abd:      + bowel sounds; soft, non-tender; no palpable masses, no distension Ext:    No edema; adequate peripheral perfusion Skin:      Warm and dry; no rash Neuro: alert and oriented x 3 Psych: normal mood and affect   Data Reviewed: Imaging:  Cardiac CT 04/29/2022-visualized lungs show groundglass opacities, thickening of the peribronchovascular interstitium  High-resolution CT 07/08/2022-mild basilar peripheral opacities with linear scarring.  Alternate diagnosis.  High resolution CT 07/06/2023-mild patchy air trapping with some improvement.  Interstitial lung disease is stable. I reviewed images personally  PFTs: 08/12/2022 FVC 2.30 [91%], FEV1 1.84 [102%], F/F 84, TLC 4.20 [91%], DLCO 14.92 [86%] Normal test  Labs: Autoimmune serologies 06/09/2022-positive  only for ANA 1: 40 nuclear homogeneous, SSB 2.6 Hypersensitivity panel 06/09/2022-negative  Assessment:  Interstitial lung disease Review of the scan shows some vague interstitial changes in a nonspecific pattern.  Findings are not really suggestive of interstitial lung disease and may be postinflammatory scarring though she denies any pneumonia or COVID  infection.  Serologies are negative except for borderline ANA and positive SSB.  Finished rheumatology evaluation and not felt to have any autoimmune process We discussed her use of down comforter and possibility that this could be causing the lung issue.  She is very fond of this and does not want to get rid of it just yet.    Repeat high-res CT shows improvement in trapping and stability of peripheral scarring opacities.  As she is feeling well we will continue to monitor this  Plan/Recommendations: Repeat high-res CT in 12 months   Chilton Greathouse MD [facility] 07/28/2023, 11:12 AM  CC: Merri Brunette, MD

## 2023-08-26 ENCOUNTER — Other Ambulatory Visit (HOSPITAL_BASED_OUTPATIENT_CLINIC_OR_DEPARTMENT_OTHER): Payer: Self-pay

## 2023-08-26 MED ORDER — FLUAD 0.5 ML IM SUSY
0.5000 mL | PREFILLED_SYRINGE | Freq: Once | INTRAMUSCULAR | 0 refills | Status: AC
  Filled 2023-08-26: qty 0.5, 1d supply, fill #0

## 2023-08-26 MED ORDER — COMIRNATY 30 MCG/0.3ML IM SUSY
PREFILLED_SYRINGE | INTRAMUSCULAR | 0 refills | Status: AC
  Filled 2023-08-26: qty 0.3, 1d supply, fill #0

## 2023-08-30 DIAGNOSIS — S22080A Wedge compression fracture of T11-T12 vertebra, initial encounter for closed fracture: Secondary | ICD-10-CM | POA: Diagnosis not present

## 2023-08-30 DIAGNOSIS — M8000XG Age-related osteoporosis with current pathological fracture, unspecified site, subsequent encounter for fracture with delayed healing: Secondary | ICD-10-CM | POA: Diagnosis not present

## 2023-08-31 DIAGNOSIS — Z961 Presence of intraocular lens: Secondary | ICD-10-CM | POA: Diagnosis not present

## 2023-08-31 DIAGNOSIS — H40013 Open angle with borderline findings, low risk, bilateral: Secondary | ICD-10-CM | POA: Diagnosis not present

## 2023-09-24 ENCOUNTER — Ambulatory Visit: Payer: Medicare HMO | Admitting: Orthopaedic Surgery

## 2023-09-24 VITALS — BP 156/82 | HR 59 | Ht 61.0 in | Wt 161.0 lb

## 2023-09-24 DIAGNOSIS — M545 Low back pain, unspecified: Secondary | ICD-10-CM | POA: Diagnosis not present

## 2023-09-24 DIAGNOSIS — G8929 Other chronic pain: Secondary | ICD-10-CM | POA: Diagnosis not present

## 2023-09-24 NOTE — Progress Notes (Deleted)
   Office Visit Note   Patient: Kelsey Elliott           Date of Birth: [birth year]           MRN: [id number] Visit Date: 09/24/2023              Requested by: Merri Brunette, MD [address] [place],  [place] [address] PCP: Merri Brunette, MD   Assessment & Plan: Visit Diagnoses: No diagnosis found.  Plan: ***  Follow-Up Instructions: No follow-ups on file.   Orders:  No orders of the defined types were placed in this encounter.  No orders of the defined types were placed in this encounter.     Procedures: No procedures performed   Clinical Data: No additional findings.   Subjective: Chief Complaint  Patient presents with   Middle Back - Fracture    HPI  Review of Systems   Objective: Vital Signs: BP (!) 156/82   Pulse (!) 59   Ht 5\' 1"  (1.549 m)   Wt 161 lb (73 kg)   BMI 30.42 kg/m   Physical Exam  Ortho Exam  Specialty Comments:  No specialty comments available.  Imaging: No results found.   PMFS History: Patient Active Problem List   Diagnosis Date Noted   Positive ANA (antinuclear antibody) 08/31/2022   Osteoarthritis 08/31/2022   Lumbar radiculopathy 03/27/2022   Pain of left hip joint 03/10/2022   Sciatica 03/10/2022   Mitral regurgitation    Elevated liver enzymes 02/16/2013   Encounter for long-term (current) use of medications 02/16/2013   Hypercholesterolemia 02/16/2013   Foot pain, bilateral 07/12/2012   Metatarsalgia of both feet 07/12/2012   Past Medical History:  Diagnosis Date   Abdominal pain    Allergic rhinitis    Atrophic vaginitis    Chest pain    Eczema    Elevated liver enzymes    Fatigue    Fatty liver    Headache    Hypercholesterolemia    Hypertension    Mitral regurgitation    mild by echo 01/2021   Osteopenia    Tachycardia     Family History  Problem Relation Age of Onset   Cancer Mother        BREAST AND OVARIAN   Heart failure Father    Kidney disease Father    Osteoarthritis  Sister    Obesity Brother    Autoimmune disease Daughter    Psoriasis Daughter     Past Surgical History:  Procedure Laterality Date   ABDOMINAL HYSTERECTOMY     GASTROC RECESSION EXTREMITY     SUSPENSIONPLASTY     TOE REPAIR     Social History   Occupational History   Not on file  Tobacco Use   Smoking status: Former    Current packs/day: 0.00    Types: Cigarettes    Quit date: 1971    Years since quitting: 53.8    Passive exposure: Never   Smokeless tobacco: Never  Substance and Sexual Activity   Alcohol use: Yes    Comment: occasionally   Drug use: Never   Sexual activity: Not on file

## 2023-09-27 NOTE — Progress Notes (Signed)
Office Visit Note   Patient: Kelsey Elliott           Date of Birth: 1946/11/05           MRN: [id number] Visit Date: 09/24/2023              Requested by: Merri Brunette, MD [address] [place],  [place] [address] PCP: Merri Brunette, MD   Assessment & Plan: Visit Diagnoses:  1. Chronic midline low back pain without sciatica     Plan: Will set up for some physical therapy for core strengthening exercises.  We reviewed the chronic endplate fracture and previous studies as documented unchanged over the last year to 2 years.  She can follow-up with me after therapy if she continues to have symptoms.  Follow-Up Instructions: No follow-ups on file.   Orders:  Orders Placed This Encounter  Procedures   Ambulatory referral to Physical Therapy   No orders of the defined types were placed in this encounter.     Procedures: No procedures performed   Clinical Data: No additional findings.   Subjective: Chief Complaint  Patient presents with   Middle Back - Fracture    HPI 77 year old female with chronic T12 compression inferior endplate.  She is on Prolia for osteoporosis.  She has CT of her lung which showed the inferior fracture.  Similar appearance on previous CT high-resolution July 2023.  Patient is noted lower extremity weakness.  She has been on tramadol 50 mg.  She noticed that she had increased pain when she picking up but groceries at 1 time.  No associated bowel or bladder symptoms.  Review of Systems all systems noncontributory to HPI.   Objective: Vital Signs: BP (!) 156/82   Pulse (!) 59   Ht 5\' 1"  (1.549 m)   Wt 161 lb (73 kg)   BMI 30.42 kg/m   Physical Exam Constitutional:      Appearance: She is well-developed.  HENT:     Head: Normocephalic.     Right Ear: External ear normal.     Left Ear: External ear normal. There is no impacted cerumen.  Eyes:     Pupils: Pupils are equal, round, and reactive to light.  Neck:     Thyroid: No  thyromegaly.     Trachea: No tracheal deviation.  Cardiovascular:     Rate and Rhythm: Normal rate.  Pulmonary:     Effort: Pulmonary effort is normal.  Abdominal:     Palpations: Abdomen is soft.  Musculoskeletal:     Cervical back: No rigidity.  Skin:    General: Skin is warm and dry.  Neurological:     Mental Status: She is alert and oriented to person, place, and time.  Psychiatric:        Behavior: Behavior normal.     Ortho Exam negative straight leg raising 90 degrees negative logroll hips.  Knee and ankle jerk are intact anterior tib is intact L5-S1 sensation is intact .  Specialty Comments:  No specialty comments available.  Imaging: No results found.   PMFS History: Patient Active Problem List   Diagnosis Date Noted   Positive ANA (antinuclear antibody) 08/31/2022   Osteoarthritis 08/31/2022   Lumbar radiculopathy 03/27/2022   Pain of left hip joint 03/10/2022   Sciatica 03/10/2022   Mitral regurgitation    Elevated liver enzymes 02/16/2013   Encounter for long-term (current) use of medications 02/16/2013   Hypercholesterolemia 02/16/2013   Foot pain, bilateral 07/12/2012  Metatarsalgia of both feet 07/12/2012   Past Medical History:  Diagnosis Date   Abdominal pain    Allergic rhinitis    Atrophic vaginitis    Chest pain    Eczema    Elevated liver enzymes    Fatigue    Fatty liver    Headache    Hypercholesterolemia    Hypertension    Mitral regurgitation    mild by echo 01/2021   Osteopenia    Tachycardia     Family History  Problem Relation Age of Onset   Cancer Mother        BREAST AND OVARIAN   Heart failure Father    Kidney disease Father    Osteoarthritis Sister    Obesity Brother    Autoimmune disease Daughter    Psoriasis Daughter     Past Surgical History:  Procedure Laterality Date   ABDOMINAL HYSTERECTOMY     GASTROC RECESSION EXTREMITY     SUSPENSIONPLASTY     TOE REPAIR     Social History   Occupational History    Not on file  Tobacco Use   Smoking status: Former    Current packs/day: 0.00    Types: Cigarettes    Quit date: 1971    Years since quitting: 53.8    Passive exposure: Never   Smokeless tobacco: Never  Substance and Sexual Activity   Alcohol use: Yes    Comment: occasionally   Drug use: Never   Sexual activity: Not on file

## 2023-09-30 ENCOUNTER — Encounter (INDEPENDENT_AMBULATORY_CARE_PROVIDER_SITE_OTHER): Payer: Self-pay

## 2023-10-04 ENCOUNTER — Encounter (INDEPENDENT_AMBULATORY_CARE_PROVIDER_SITE_OTHER): Payer: Self-pay

## 2023-10-04 ENCOUNTER — Ambulatory Visit (INDEPENDENT_AMBULATORY_CARE_PROVIDER_SITE_OTHER): Payer: Medicare HMO | Admitting: Otolaryngology

## 2023-10-04 VITALS — Ht 61.0 in | Wt 160.0 lb

## 2023-10-04 DIAGNOSIS — H6123 Impacted cerumen, bilateral: Secondary | ICD-10-CM

## 2023-10-04 DIAGNOSIS — H903 Sensorineural hearing loss, bilateral: Secondary | ICD-10-CM | POA: Diagnosis not present

## 2023-10-05 DIAGNOSIS — H6123 Impacted cerumen, bilateral: Secondary | ICD-10-CM | POA: Insufficient documentation

## 2023-10-05 DIAGNOSIS — H903 Sensorineural hearing loss, bilateral: Secondary | ICD-10-CM | POA: Insufficient documentation

## 2023-10-05 NOTE — Progress Notes (Unsigned)
Patient ID: Kelsey Elliott, female   DOB: 1946/07/09, 77 y.o.   MRN: [id number]  Follow-up: Hearing loss, recurrent cerumen impaction  HPI: The patient is a 77 year old female who returns today for her follow-up evaluation.  The patient has a history of asymmetric sensorineural hearing loss.  Her hearing loss is slightly worse on the left side.  She also has a history of recurrent cerumen impaction.  The patient presents today complaining of increasing clogging sensation in her ears.  She denies any significant change in her hearing.  She currently wears bilateral hearing aids.  She denies any otalgia, otorrhea, or vertigo. No other ENT, GI, or respiratory issue noted since the last visit.   Exam: General: Communicates without difficulty, well nourished, no acute distress. Head: Normocephalic, no evidence injury, no tenderness, facial buttresses intact without stepoff. Eyes: PERRL, EOMI. No scleral icterus, conjunctivae clear. Neuro: CN II exam reveals vision grossly intact. No nystagmus at any point of gaze. Auricles: Intact without lesions. EAC: Bilateral cerumen impaction. The patient's left ear canal, tympanic membrane and middle ear space are all normal. Nose: External evaluation reveals normal support and skin without lesions. Dorsum is intact. Anterior rhinoscopy reveals healthy pink mucosa over anterior aspect of inferior turbinates and intact septum. No purulence noted. Oral: Oral cavity and oropharynx are intact, symmetric, without erythema or edema. Mucosa is moist without lesions. Neck: Full range of motion without pain. There is no significant lymphadenopathy. No masses palpable. Thyroid bed within normal limits to palpation. Parotid glands and submandibular glands equal bilaterally without mass. Trachea is midline. Neuro:  CN 2-12 grossly intact.   Procedure: Bilateral cerumen disimpaction.  Anesthesia: None.  Description: Under the operating microscope, the cerumen is carefully removed with a  combination of cerumen currette, alligator forceps, and suction catheters. After the cerumen is removed, the TMs are noted to be normal. No mass, erythema, or lesions. The patient tolerated the procedure well.   Assessment: 1.  Bilateral cerumen impaction.  After the disimpaction procedure, her tympanic membrane's and middle ear spaces are noted to be normal. 2.  Subjectively stable bilateral high-frequency sensorineural hearing loss, slightly worse on the left side.  The patient defers hearing test today.  Plan: 1.  Otomicroscopy with bilateral cerumen disimpaction. 2.  The physical exam findings are reviewed with the patient. 3.  The patient should continue the use of her hearing aids. 4.  The patient will return for reevaluation in 1 year.

## 2023-10-06 ENCOUNTER — Other Ambulatory Visit: Payer: Self-pay

## 2023-10-06 ENCOUNTER — Ambulatory Visit: Payer: Medicare HMO | Admitting: Physical Therapy

## 2023-10-06 DIAGNOSIS — G8929 Other chronic pain: Secondary | ICD-10-CM

## 2023-10-06 DIAGNOSIS — M6281 Muscle weakness (generalized): Secondary | ICD-10-CM

## 2023-10-06 DIAGNOSIS — R293 Abnormal posture: Secondary | ICD-10-CM

## 2023-10-06 DIAGNOSIS — M5459 Other low back pain: Secondary | ICD-10-CM

## 2023-10-06 DIAGNOSIS — M545 Low back pain, unspecified: Secondary | ICD-10-CM | POA: Diagnosis not present

## 2023-10-06 NOTE — Therapy (Signed)
OUTPATIENT PHYSICAL THERAPY THORACOLUMBAR EVALUATION   Patient Name: Kelsey Elliott MRN: [id number] DOB:29-Apr-1946, 77 y.o., female Today's Date: 10/06/2023  END OF SESSION:  PT End of Session - 10/06/23 1221     Visit Number 1    Date for PT Re-Evaluation 12/14/23    Authorization Type [employer] MEDICARE HMO/PPO    Progress Note Due on Visit 10    PT Start Time 1145    PT Stop Time 1225    PT Time Calculation (min) 40 min    Activity Tolerance Patient tolerated treatment well    Behavior During Therapy [facility] for tasks assessed/performed             Past Medical History:  Diagnosis Date   Abdominal pain    Allergic rhinitis    Atrophic vaginitis    Chest pain    Eczema    Elevated liver enzymes    Fatigue    Fatty liver    Headache    Hypercholesterolemia    Hypertension    Mitral regurgitation    mild by echo 01/2021   Osteopenia    Tachycardia    Past Surgical History:  Procedure Laterality Date   ABDOMINAL HYSTERECTOMY     GASTROC RECESSION EXTREMITY     SUSPENSIONPLASTY     TOE REPAIR     Patient Active Problem List   Diagnosis Date Noted   Sensorineural hearing loss, bilateral 10/05/2023   Impacted cerumen of both ears 10/05/2023   Positive ANA (antinuclear antibody) 08/31/2022   Osteoarthritis 08/31/2022   Lumbar radiculopathy 03/27/2022   Pain of left hip joint 03/10/2022   Sciatica 03/10/2022   Mitral regurgitation    Elevated liver enzymes 02/16/2013   Encounter for long-term (current) use of medications 02/16/2013   Hypercholesterolemia 02/16/2013   Foot pain, bilateral 07/12/2012   Metatarsalgia of both feet 07/12/2012    PCP: Merri Brunette, MD  REFERRING PROVIDER: Eldred Manges, MD  REFERRING DIAG: Chronic midline low back pain without sciatica [M54.50, G89.29]   Rationale for Evaluation and Treatment: Rehabilitation  THERAPY DIAG:  Other low back pain  Abnormal posture  Muscle weakness (generalized)  ONSET DATE: several months.    SUBJECTIVE:                                                                                                                                                                                           SUBJECTIVE STATEMENT: Pt states that she had to have a recent x- ray of her lung when the images showed a fractured vertebrae (T12). She is unaware of when she fractured her back, but thinks it  may be when she was carrying groceries into the house. She has had ongoing lower back pain for several months, but recently noted a sharp pain with extension based movements.   PERTINENT HISTORY:  HTN, Motral regurgitation, tachycardia, osteopenia  PAIN:  Are you having pain? Yes: NPRS scale: 6-7/10 Pain location: lower back  Pain description: Dull ache, sharp pain when doing laundry, washing dishes.  Aggravating factors: Standing upright,  Relieving factors: Flexion,   PRECAUTIONS: Other: Tachycardia.   RED FLAGS: None   WEIGHT BEARING RESTRICTIONS: No  FALLS:  Has patient fallen in last 6 months? No  LIVING ENVIRONMENT: Lives with: lives with their family Lives in: House/apartment Stairs: 3 floors, with laundry in basement. Pt states that she is up and down a lot.  Has following equipment at home: None  OCCUPATION: Retired  PLOF: Independent  PATIENT GOALS: Pt would like to reduce pain. She would like to get back to walking.   NEXT MD VISIT: April [id number]  OBJECTIVE:  Note: Objective measures were completed at Evaluation unless otherwise noted.  DIAGNOSTIC FINDINGS:  None recently of her vertebrae.   PATIENT SURVEYS:  FOTO 47.1%, 59%   SCREENING FOR RED FLAGS: Bowel or bladder incontinence: No  COGNITION: Overall cognitive status: Within functional limits for tasks assessed     SENSATION: WFL   POSTURE: rounded shoulders, forward head, and decreased lumbar lordosis  PALPATION: Tenderness to palpation at lumbar paraspinals and multifidi.   LUMBAR ROM:   Pt has  functional ROM of her lumbar spine with exception of lumbar flexion. Pt performs a hip hinge with lumbar flexion.  (Blank rows = not tested)  LOWER EXTREMITY ROM:     Active  Right eval Left eval  Hip flexion [facility]  Knee flexion [place] WFL  Knee extension WFL WFL   (Blank rows = not tested)  LOWER EXTREMITY MMT:    MMT Right eval Left eval  Hip flexion 4 4  Knee flexion 4+ 4  Knee extension 4+ 4   (Blank rows = not tested)   FUNCTIONAL TESTS:  5 times sit to stand: 11.76 sec  Timed up and go (TUG): 9.52 sec  GAIT: Distance walked: 65ft  Assistive device utilized: None Level of assistance: Complete Independence Comments: Pt ambulates with mild gait deficits.   TODAY'S TREATMENT:                                                                                                                              DATE: Creating, reviewing, and completing below HEP    PATIENT EDUCATION:  Education details: Educated pt on anatomy and physiology of current symptoms, FOTO, diagnosis, prognosis, HEP,  and POC. Person educated: Patient Education method: Medical illustrator Education comprehension: verbalized understanding and returned demonstration  HOME EXERCISE PROGRAM: Access Code: [email or web address] URL: [email or web address] Date: 10/06/2023 Prepared by: Royal Hawthorn  Exercises - Supine Lower Trunk Rotation  - 2-3 x daily - 7 x weekly -  1 sets - 10 reps - Supine Single Knee to Chest Stretch  - 2-3 x daily - 7 x weekly - 1 sets - 10 reps - Supine Bridge  - 2-3 x daily - 7 x weekly - 1 sets - 10 reps  ASSESSMENT:  CLINICAL IMPRESSION: Patient referred to PT for acute on chronic lower back pain. She has a known T12 vertebral fracture found during recent imaging. She is limited with walking secondary to pain. Pt used to be very active, but reports being more sedentary due to pain. Pt would like to be able to get back to walking 2+ miles without onset of pain. Pt has  limited lumbar flexion, but demonstrates functional ROM and good strength. She tolerated gentle mobility exercises today. Patient will benefit from skilled PT to address below impairments, limitations and improve overall function.  OBJECTIVE IMPAIRMENTS: decreased activity tolerance, difficulty walking, decreased balance, decreased endurance, decreased mobility, decreased ROM, decreased strength, impaired flexibility, impaired UE/LE use, postural dysfunction, and pain.  ACTIVITY LIMITATIONS: bending, lifting, carry, locomotion, cleaning, community activity, driving, and or occupation  PERSONAL FACTORS: HTN, Motral regurgitation, tachycardia, osteopenia are also affecting patient's functional outcome.  REHAB POTENTIAL: Good  CLINICAL DECISION MAKING: Stable/uncomplicated  EVALUATION COMPLEXITY: Low    GOALS: Short term PT Goals Target date: 10/20/2023 Pt will be I and compliant with HEP. Baseline:  Goal status: New Pt will decrease pain by 25% overall Baseline: Goal status: New  Long term PT goals Target date: 12/14/2023 Pt will improve lumbar flexion ROM to [facility] to improve functional mobility Baseline: Goal status: New Pt will improve  hip/knee strength to at least 5-/5 MMT to improve functional strength Baseline: Goal status: New Pt will improve FOTO to at least % functional to show improved function Baseline: Goal status: New Pt will reduce pain by overall 50% overall with usual activity Baseline: Goal status: New Pt will reduce pain to overall less than 2-3/10 with usual activity and work activity. Baseline: Goal status: New Pt will be able to ambulate at least 2 miles WNL gait pattern without complaints Baseline: Goal status: New  PLAN: PT FREQUENCY: 1-2 times per week   PT DURATION: 6-8 weeks  PLANNED INTERVENTIONS (unless contraindicated): aquatic PT, Canalith repositioning, cryotherapy, Electrical stimulation, Iontophoresis with 4 mg/ml dexamethasome, Moist  heat, traction, Ultrasound, gait training, Therapeutic exercise, balance training, neuromuscular re-education, patient/family education, prosthetic training, manual techniques, passive ROM, dry needling, taping, vasopnuematic device, vestibular, spinal manipulations, joint manipulations  PLAN FOR NEXT SESSION: Assess HEP/update PRN, continue to progress functional mobility, strengthen proximal hip muscles. DN?    Champ Mungo, PT 10/06/2023, 12:22 PM

## 2023-10-14 ENCOUNTER — Encounter: Payer: Self-pay | Admitting: Rehabilitative and Restorative Service Providers"

## 2023-10-14 ENCOUNTER — Ambulatory Visit: Payer: Medicare HMO | Admitting: Rehabilitative and Restorative Service Providers"

## 2023-10-14 DIAGNOSIS — M5459 Other low back pain: Secondary | ICD-10-CM | POA: Diagnosis not present

## 2023-10-14 DIAGNOSIS — M6281 Muscle weakness (generalized): Secondary | ICD-10-CM

## 2023-10-14 DIAGNOSIS — R293 Abnormal posture: Secondary | ICD-10-CM

## 2023-10-14 NOTE — Therapy (Signed)
OUTPATIENT PHYSICAL THERAPY TREATMENT   Patient Name: Kelsey Elliott MRN: [id number] DOB:27-Oct-1946, 77 y.o., female Today's Date: 10/14/2023  END OF SESSION:  PT End of Session - 10/14/23 0916     Visit Number 2    Number of Visits 16    Date for PT Re-Evaluation 12/14/23    Authorization Type [employer] MEDICARE HMO/PPO    Progress Note Due on Visit 10    PT Start Time 0922    PT Stop Time 1001    PT Time Calculation (min) 39 min    Activity Tolerance Patient tolerated treatment well    Behavior During Therapy [facility] for tasks assessed/performed              Past Medical History:  Diagnosis Date   Abdominal pain    Allergic rhinitis    Atrophic vaginitis    Chest pain    Eczema    Elevated liver enzymes    Fatigue    Fatty liver    Headache    Hypercholesterolemia    Hypertension    Mitral regurgitation    mild by echo 01/2021   Osteopenia    Tachycardia    Past Surgical History:  Procedure Laterality Date   ABDOMINAL HYSTERECTOMY     GASTROC RECESSION EXTREMITY     SUSPENSIONPLASTY     TOE REPAIR     Patient Active Problem List   Diagnosis Date Noted   Sensorineural hearing loss, bilateral 10/05/2023   Impacted cerumen of both ears 10/05/2023   Positive ANA (antinuclear antibody) 08/31/2022   Osteoarthritis 08/31/2022   Lumbar radiculopathy 03/27/2022   Pain of left hip joint 03/10/2022   Sciatica 03/10/2022   Mitral regurgitation    Elevated liver enzymes 02/16/2013   Encounter for long-term (current) use of medications 02/16/2013   Hypercholesterolemia 02/16/2013   Foot pain, bilateral 07/12/2012   Metatarsalgia of both feet 07/12/2012    PCP: Merri Brunette, MD  REFERRING PROVIDER: Eldred Manges, MD  REFERRING DIAG: Chronic midline low back pain without sciatica [M54.50, G89.29]   Rationale for Evaluation and Treatment: Rehabilitation  THERAPY DIAG:  Abnormal posture  Muscle weakness (generalized)  Other low back pain  ONSET DATE: several  months.   SUBJECTIVE:                                                                                                                                                                                           SUBJECTIVE STATEMENT: Pt. Indicated standing prolonged seemed to still cause symptoms, > 5-10 mins in duration. Pt indicated sitting takes some time to reduce. Pt indicated doing HEP and  feeling some looser.   PERTINENT HISTORY:  HTN, Motral regurgitation, tachycardia, osteopenia  PAIN:  NPRS scale: up to 7/10 since last visit at worst Pain location: lower back  Pain description: Dull ache, sharp pain when doing laundry, washing dishes.  Aggravating factors: Standing upright,  Relieving factors: Flexion,   PRECAUTIONS: Other: Tachycardia.   RED FLAGS: None   WEIGHT BEARING RESTRICTIONS: No  FALLS:  Has patient fallen in last 6 months? No  LIVING ENVIRONMENT: Lives with: lives with their family Lives in: House/apartment Stairs: 3 floors, with laundry in basement. Pt states that she is up and down a lot.  Has following equipment at home: None  OCCUPATION: Retired  PLOF: Independent  PATIENT GOALS: Pt would like to reduce pain. She would like to get back to walking.   NEXT MD VISIT: April [id number]  OBJECTIVE:  Note: Objective measures were completed at Evaluation unless otherwise noted.  DIAGNOSTIC FINDINGS:  10/06/2023 None recently of her vertebrae.   PATIENT SURVEYS:  10/06/2023 FOTO 47.1%, 59%   SCREENING FOR RED FLAGS: Bowel or bladder incontinence: No  COGNITION: 10/06/2023 Overall cognitive status: Within functional limits for tasks assessed     SENSATION: 10/06/2023 [facility]   POSTURE:  10/06/2023 rounded shoulders, forward head, and decreased lumbar lordosis  PALPATION: 10/06/2023 Tenderness to palpation at lumbar paraspinals and multifidi.   LUMBAR ROM:   ROM AROM  10/14/2023  Flexion To ankles without normal back pian  Extension 50% WFL  with backtightness  X5 in standing - improved to 75%   Right lateral flexion   Left lateral flexion   Right rotation   Left rotation    (Blank rows = not tested)  10/06/2023 Pt has functional ROM of her lumbar spine with exception of lumbar flexion. Pt performs a hip hinge with lumbar flexion.  (Blank rows = not tested)  LOWER EXTREMITY ROM:     Active  Right 10/06/2023 Left 10/06/2023  Hip flexion [facility]  Knee flexion [facility] WFL  Knee extension WFL WFL   (Blank rows = not tested)  LOWER EXTREMITY MMT:    MMT Right 10/06/2023 Left 10/06/2023  Hip flexion 4 4  Knee flexion 4+ 4  Knee extension 4+ 4   (Blank rows = not tested)   FUNCTIONAL TESTS:  10/06/2023 5 times sit to stand: 11.76 sec  Timed up and go (TUG): 9.52 sec  GAIT: 10/06/2023 Distance walked: 71ft  Assistive device utilized: None Level of assistance: Complete Independence Comments: Pt ambulates with mild gait deficits.                    TODAY'S TREATMENT:                                                       DATE:  10/14/2023 Therex Nustep lvl 5 10 mins UE/LE  Standing lumbar extension AROM x 5 Supine lumbar trunk rotation 15 sec x 3 bilateral Supine bridge 2-3 sec hold x 15 Supine hooklying clamshell c isometric hold contralateral leg blue band x 20 bilaterally Supine isometric hip flexion hold with contralateral UE 5 sec x 10 bilaterally  Additional time spent in review of existing HEP and cues for techniques.    TODAY'S TREATMENT:  DATE:  10/06/2023 Creating, reviewing, and completing below HEP    PATIENT EDUCATION:  Education details: HEP update Person educated: Patient Education method: Medical illustrator Education comprehension: verbalized understanding and returned demonstration  HOME EXERCISE PROGRAM: Access Code: [email or web address] URL: [email or web address] Date: 10/14/2023 Prepared by: Chyrel Masson  Exercises - Supine Lower Trunk Rotation  - 2-3 x daily - 7 x weekly - 1 sets - 3-5 reps - 15 hold - Supine Single Knee to Chest Stretch  - 2-3 x daily - 7 x weekly - 1 sets - 3-5 reps - 15 hold - Supine Bridge  - 1-2 x daily - 7 x weekly - 1-2 sets - 10 reps - 2-3 hold - Standing Lumbar Extension with Counter  - 3-5 x daily - 7 x weekly - 1 sets - 5-10 reps  ASSESSMENT:  CLINICAL IMPRESSION:  Limitation in lumbar extension c symptoms noted as checked today in ROM.   Reviewed c HEP c occasional cues required for adjustment of techniques to improve movement tolerance.   OBJECTIVE IMPAIRMENTS: decreased activity tolerance, difficulty walking, decreased balance, decreased endurance, decreased mobility, decreased ROM, decreased strength, impaired flexibility, impaired UE/LE use, postural dysfunction, and pain.  ACTIVITY LIMITATIONS: bending, lifting, carry, locomotion, cleaning, community activity, driving, and or occupation  PERSONAL FACTORS: HTN, Motral regurgitation, tachycardia, osteopenia are also affecting patient's functional outcome.  REHAB POTENTIAL: Good  CLINICAL DECISION MAKING: Stable/uncomplicated  EVALUATION COMPLEXITY: Low    GOALS: Short term PT Goals Target date: 10/20/2023 Pt will be I and compliant with HEP. Baseline:  Goal status: on going 10/14/2023  Pt will decrease pain by 25% overall Baseline: Goal status: on going 10/14/2023  Long term PT goals Target date: 12/14/2023 Pt will improve lumbar flexion ROM to [facility] to improve functional mobility Baseline: Goal status: New Pt will improve  hip/knee strength to at least 5-/5 MMT to improve functional strength Baseline: Goal status: New Pt will improve FOTO to at least % functional to show improved function Baseline: Goal status: New Pt will reduce pain by overall 50% overall with usual activity Baseline: Goal status: New Pt will reduce pain to overall less than 2-3/10 with usual activity and work  activity. Baseline: Goal status: New Pt will be able to ambulate at least 2 miles WNL gait pattern without complaints Baseline: Goal status: New  PLAN: PT FREQUENCY: 1-2 times per week   PT DURATION: 6-8 weeks  PLANNED INTERVENTIONS (unless contraindicated): aquatic PT, Canalith repositioning, cryotherapy, Electrical stimulation, Iontophoresis with 4 mg/ml dexamethasome, Moist heat, traction, Ultrasound, gait training, Therapeutic exercise, balance training, neuromuscular re-education, patient/family education, prosthetic training, manual techniques, passive ROM, dry needling, taping, vasopnuematic device, vestibular, spinal manipulations, joint manipulations  PLAN FOR NEXT SESSION: Recheck lumbar mobility response.    Chyrel Masson, PT, DPT, OCS, ATC 10/14/23  9:59 AM

## 2023-10-19 ENCOUNTER — Encounter: Payer: Self-pay | Admitting: Physical Therapy

## 2023-10-19 ENCOUNTER — Ambulatory Visit: Payer: Medicare HMO | Admitting: Physical Therapy

## 2023-10-19 DIAGNOSIS — M5459 Other low back pain: Secondary | ICD-10-CM

## 2023-10-19 DIAGNOSIS — R293 Abnormal posture: Secondary | ICD-10-CM | POA: Diagnosis not present

## 2023-10-19 DIAGNOSIS — M6281 Muscle weakness (generalized): Secondary | ICD-10-CM

## 2023-10-19 NOTE — Therapy (Signed)
OUTPATIENT PHYSICAL THERAPY TREATMENT   Patient Name: KARSYNN DEWEESE MRN: [id number] DOB:1946-07-09, 77 y.o., female Today's Date: 10/19/2023  END OF SESSION:  PT End of Session - 10/19/23 1433     Visit Number 3    Number of Visits 16    Date for PT Re-Evaluation 12/14/23    Authorization Type [employer] MEDICARE HMO/PPO    Progress Note Due on Visit 10    PT Start Time 1427    PT Stop Time 1510    PT Time Calculation (min) 43 min    Activity Tolerance Patient tolerated treatment well    Behavior During Therapy [facility] for tasks assessed/performed              Past Medical History:  Diagnosis Date   Abdominal pain    Allergic rhinitis    Atrophic vaginitis    Chest pain    Eczema    Elevated liver enzymes    Fatigue    Fatty liver    Headache    Hypercholesterolemia    Hypertension    Mitral regurgitation    mild by echo 01/2021   Osteopenia    Tachycardia    Past Surgical History:  Procedure Laterality Date   ABDOMINAL HYSTERECTOMY     GASTROC RECESSION EXTREMITY     SUSPENSIONPLASTY     TOE REPAIR     Patient Active Problem List   Diagnosis Date Noted   Sensorineural hearing loss, bilateral 10/05/2023   Impacted cerumen of both ears 10/05/2023   Positive ANA (antinuclear antibody) 08/31/2022   Osteoarthritis 08/31/2022   Lumbar radiculopathy 03/27/2022   Pain of left hip joint 03/10/2022   Sciatica 03/10/2022   Mitral regurgitation    Elevated liver enzymes 02/16/2013   Encounter for long-term (current) use of medications 02/16/2013   Hypercholesterolemia 02/16/2013   Foot pain, bilateral 07/12/2012   Metatarsalgia of both feet 07/12/2012    PCP: Merri Brunette, MD  REFERRING PROVIDER: Eldred Manges, MD  REFERRING DIAG: Chronic midline low back pain without sciatica [M54.50, G89.29]   Rationale for Evaluation and Treatment: Rehabilitation  THERAPY DIAG:  Abnormal posture  Muscle weakness (generalized)  Other low back pain  ONSET DATE: several  months.   SUBJECTIVE:                                                                                                                                                                                           SUBJECTIVE STATEMENT: She reports some improvements since starting PT, does not feel as tight  PERTINENT HISTORY:  HTN, Motral regurgitation, tachycardia, osteopenia  PAIN:  NPRS scale: up  to 7/10 since last visit at worst Pain location: lower back  Pain description: Dull ache, sharp pain when doing laundry, washing dishes.  Aggravating factors: Standing upright,  Relieving factors: Flexion,   PRECAUTIONS: Other: Tachycardia.   RED FLAGS: None   WEIGHT BEARING RESTRICTIONS: No  FALLS:  Has patient fallen in last 6 months? No  LIVING ENVIRONMENT: Lives with: lives with their family Lives in: House/apartment Stairs: 3 floors, with laundry in basement. Pt states that she is up and down a lot.  Has following equipment at home: None  OCCUPATION: Retired  PLOF: Independent  PATIENT GOALS: Pt would like to reduce pain. She would like to get back to walking.   NEXT MD VISIT: April [id number]  OBJECTIVE:  Note: Objective measures were completed at Evaluation unless otherwise noted.  DIAGNOSTIC FINDINGS:  10/06/2023 None recently of her vertebrae.   PATIENT SURVEYS:  10/06/2023 FOTO 47.1%, 59%   SCREENING FOR RED FLAGS: Bowel or bladder incontinence: No  COGNITION: 10/06/2023 Overall cognitive status: Within functional limits for tasks assessed     SENSATION: 10/06/2023 [facility]   POSTURE:  10/06/2023 rounded shoulders, forward head, and decreased lumbar lordosis  PALPATION: 10/06/2023 Tenderness to palpation at lumbar paraspinals and multifidi.   LUMBAR ROM:   ROM AROM  10/14/2023  Flexion To ankles without normal back pian  Extension 50% WFL with backtightness  X5 in standing - improved to 75%   Right lateral flexion   Left lateral flexion   Right  rotation   Left rotation    (Blank rows = not tested)  10/06/2023 Pt has functional ROM of her lumbar spine with exception of lumbar flexion. Pt performs a hip hinge with lumbar flexion.  (Blank rows = not tested)  LOWER EXTREMITY ROM:     Active  Right 10/06/2023 Left 10/06/2023  Hip flexion [facility]  Knee flexion [facility] WFL  Knee extension WFL WFL   (Blank rows = not tested)  LOWER EXTREMITY MMT:    MMT Right 10/06/2023 Left 10/06/2023  Hip flexion 4 4  Knee flexion 4+ 4  Knee extension 4+ 4   (Blank rows = not tested)   FUNCTIONAL TESTS:  10/06/2023 5 times sit to stand: 11.76 sec  Timed up and go (TUG): 9.52 sec  GAIT: 10/06/2023 Distance walked: 67ft  Assistive device utilized: None Level of assistance: Complete Independence Comments: Pt ambulates with mild gait deficits.                    TODAY'S TREATMENT:                                                       DATE:   10/19/2023 Therex Nustep lvl 5 10 mins UE/LE  Supine lumbar trunk rotation 15 sec x 3 bilateral Supine SKTC stretch 15 sec X 5 bilat Supine bridge 3 sec hold x 15 Supine hooklying clamshell c isometric hold contralateral leg blue band x 20 bilaterally Supine isometric hip flexion hold with contralateral UE 5 sec x 10 bilaterally Standing lumbar L stretch at counter X 10, holding 5 sec Standing lumbar extension AROM x 10  10/14/2023 Therex Nustep lvl 5 10 mins UE/LE  Standing lumbar extension AROM x 5 Supine lumbar trunk rotation 15 sec x 3 bilateral Supine bridge 2-3 sec hold x 15 Supine  hooklying clamshell c isometric hold contralateral leg blue band x 20 bilaterally Supine isometric hip flexion hold with contralateral UE 5 sec x 10 bilaterally  Additional time spent in review of existing HEP and cues for techniques.    TODAY'S TREATMENT:                                                       DATE:  10/06/2023 Creating, reviewing, and completing below HEP    PATIENT EDUCATION:   Education details: HEP update Person educated: Patient Education method: Medical illustrator Education comprehension: verbalized understanding and returned demonstration  HOME EXERCISE PROGRAM: Access Code: [email or web address] URL: [email or web address] Date: 10/14/2023 Prepared by: Chyrel Masson  Exercises - Supine Lower Trunk Rotation  - 2-3 x daily - 7 x weekly - 1 sets - 3-5 reps - 15 hold - Supine Single Knee to Chest Stretch  - 2-3 x daily - 7 x weekly - 1 sets - 3-5 reps - 15 hold - Supine Bridge  - 1-2 x daily - 7 x weekly - 1-2 sets - 10 reps - 2-3 hold - Standing Lumbar Extension with Counter  - 3-5 x daily - 7 x weekly - 1 sets - 5-10 reps  ASSESSMENT:  CLINICAL IMPRESSION:  She is overall improving with Lumbar ROM and mobility and had good overall tolerance to session.   OBJECTIVE IMPAIRMENTS: decreased activity tolerance, difficulty walking, decreased balance, decreased endurance, decreased mobility, decreased ROM, decreased strength, impaired flexibility, impaired UE/LE use, postural dysfunction, and pain.  ACTIVITY LIMITATIONS: bending, lifting, carry, locomotion, cleaning, community activity, driving, and or occupation  PERSONAL FACTORS: HTN, Motral regurgitation, tachycardia, osteopenia are also affecting patient's functional outcome.  REHAB POTENTIAL: Good  CLINICAL DECISION MAKING: Stable/uncomplicated  EVALUATION COMPLEXITY: Low    GOALS: Short term PT Goals Target date: 10/20/2023 Pt will be I and compliant with HEP. Baseline:  Goal status: on going 10/14/2023  Pt will decrease pain by 25% overall Baseline: Goal status: on going 10/14/2023  Long term PT goals Target date: 12/14/2023 Pt will improve lumbar flexion ROM to [facility] to improve functional mobility Baseline: Goal status: New Pt will improve  hip/knee strength to at least 5-/5 MMT to improve functional strength Baseline: Goal status: New Pt will improve FOTO to at least %  functional to show improved function Baseline: Goal status: New Pt will reduce pain by overall 50% overall with usual activity Baseline: Goal status: New Pt will reduce pain to overall less than 2-3/10 with usual activity and work activity. Baseline: Goal status: New Pt will be able to ambulate at least 2 miles WNL gait pattern without complaints Baseline: Goal status: New  PLAN: PT FREQUENCY: 1-2 times per week   PT DURATION: 6-8 weeks  PLANNED INTERVENTIONS (unless contraindicated): aquatic PT, Canalith repositioning, cryotherapy, Electrical stimulation, Iontophoresis with 4 mg/ml dexamethasome, Moist heat, traction, Ultrasound, gait training, Therapeutic exercise, balance training, neuromuscular re-education, patient/family education, prosthetic training, manual techniques, passive ROM, dry needling, taping, vasopnuematic device, vestibular, spinal manipulations, joint manipulations  PLAN FOR NEXT SESSION: Lumbar mobility and strength work as tolerated.   Ivery Quale, PT, DPT 10/19/23 2:39 PM

## 2023-10-26 ENCOUNTER — Encounter: Payer: Self-pay | Admitting: Physical Therapy

## 2023-10-26 ENCOUNTER — Ambulatory Visit: Payer: Medicare HMO | Admitting: Physical Therapy

## 2023-10-26 DIAGNOSIS — R293 Abnormal posture: Secondary | ICD-10-CM | POA: Diagnosis not present

## 2023-10-26 DIAGNOSIS — M5459 Other low back pain: Secondary | ICD-10-CM

## 2023-10-26 DIAGNOSIS — M6281 Muscle weakness (generalized): Secondary | ICD-10-CM | POA: Diagnosis not present

## 2023-10-26 NOTE — Therapy (Signed)
OUTPATIENT PHYSICAL THERAPY TREATMENT   Patient Name: ANIDA GLEATON MRN: [id number] DOB:1946-01-15, 77 y.o., female Today's Date: 10/26/2023  END OF SESSION:  PT End of Session - 10/26/23 1534     Visit Number 4    Number of Visits 16    Date for PT Re-Evaluation 12/14/23    Authorization Type [employer] MEDICARE HMO/PPO    Progress Note Due on Visit 10    PT Start Time 1436    PT Stop Time 1515    PT Time Calculation (min) 39 min    Activity Tolerance Patient tolerated treatment well    Behavior During Therapy [facility] for tasks assessed/performed               Past Medical History:  Diagnosis Date   Abdominal pain    Allergic rhinitis    Atrophic vaginitis    Chest pain    Eczema    Elevated liver enzymes    Fatigue    Fatty liver    Headache    Hypercholesterolemia    Hypertension    Mitral regurgitation    mild by echo 01/2021   Osteopenia    Tachycardia    Past Surgical History:  Procedure Laterality Date   ABDOMINAL HYSTERECTOMY     GASTROC RECESSION EXTREMITY     SUSPENSIONPLASTY     TOE REPAIR     Patient Active Problem List   Diagnosis Date Noted   Sensorineural hearing loss, bilateral 10/05/2023   Impacted cerumen of both ears 10/05/2023   Positive ANA (antinuclear antibody) 08/31/2022   Osteoarthritis 08/31/2022   Lumbar radiculopathy 03/27/2022   Pain of left hip joint 03/10/2022   Sciatica 03/10/2022   Mitral regurgitation    Elevated liver enzymes 02/16/2013   Encounter for long-term (current) use of medications 02/16/2013   Hypercholesterolemia 02/16/2013   Foot pain, bilateral 07/12/2012   Metatarsalgia of both feet 07/12/2012    PCP: Merri Brunette, MD  REFERRING PROVIDER: Eldred Manges, MD  REFERRING DIAG: Chronic midline low back pain without sciatica [M54.50, G89.29]   Rationale for Evaluation and Treatment: Rehabilitation  THERAPY DIAG:  Abnormal posture  Muscle weakness (generalized)  Other low back pain  ONSET DATE:  several months.   SUBJECTIVE:                                                                                                                                                                                           SUBJECTIVE STATEMENT: Pt reporting 5-6/10 pain today. Pt reporting more stiffness. Pt also reporting compliance in her HEP.   PERTINENT HISTORY:  HTN, Motral regurgitation, tachycardia, osteopenia  PAIN:  NPRS scale: 5-6/10 pain in her mid back Pain location: lower back  Pain description: Dull ache, sharp pain when doing laundry, washing dishes.  Aggravating factors: Standing upright,  Relieving factors: Flexion,   PRECAUTIONS: Other: Tachycardia.   RED FLAGS: None   WEIGHT BEARING RESTRICTIONS: No  FALLS:  Has patient fallen in last 6 months? No  LIVING ENVIRONMENT: Lives with: lives with their family Lives in: House/apartment Stairs: 3 floors, with laundry in basement. Pt states that she is up and down a lot.  Has following equipment at home: None  OCCUPATION: Retired  PLOF: Independent  PATIENT GOALS: Pt would like to reduce pain. She would like to get back to walking.   NEXT MD VISIT: April [id number]  OBJECTIVE:  Note: Objective measures were completed at Evaluation unless otherwise noted.  DIAGNOSTIC FINDINGS:  10/06/2023 None recently of her vertebrae.   PATIENT SURVEYS:  10/06/2023 FOTO 47.1%, 59%   SCREENING FOR RED FLAGS: Bowel or bladder incontinence: No  COGNITION: 10/06/2023 Overall cognitive status: Within functional limits for tasks assessed     SENSATION: 10/06/2023 [facility]   POSTURE:  10/06/2023 rounded shoulders, forward head, and decreased lumbar lordosis  PALPATION: 10/06/2023 Tenderness to palpation at lumbar paraspinals and multifidi.   LUMBAR ROM:   ROM AROM  10/14/2023  Flexion To ankles without normal back pian  Extension 50% WFL with backtightness  X5 in standing - improved to 75%   Right lateral flexion    Left lateral flexion   Right rotation   Left rotation    (Blank rows = not tested)  10/06/2023 Pt has functional ROM of her lumbar spine with exception of lumbar flexion. Pt performs a hip hinge with lumbar flexion.  (Blank rows = not tested)  LOWER EXTREMITY ROM:     Active  Right 10/06/2023 Left 10/06/2023  Hip flexion [facility]  Knee flexion [facility] WFL  Knee extension WFL WFL   (Blank rows = not tested)  LOWER EXTREMITY MMT:    MMT Right 10/06/2023 Left 10/06/2023  Hip flexion 4 4  Knee flexion 4+ 4  Knee extension 4+ 4   (Blank rows = not tested)   FUNCTIONAL TESTS:  10/06/2023 5 times sit to stand: 11.76 sec  Timed up and go (TUG): 9.52 sec  GAIT: 10/06/2023 Distance walked: 82ft  Assistive device utilized: None Level of assistance: Complete Independence Comments: Pt ambulates with mild gait deficits.                    TODAY'S TREATMENT:                                                       DATE:   10/26/2023 Therex Nustep lvl 5 10 mins UE/LE  Lumbar extension c elbows on the wall x 10 holding 5-10 sec Standing hip extension: 2 x 10 bil LE Leg press: bil 75#, 2 x 15 Prone press ups: x 2 holding 20 sec Manual : STM to thoracic and lumbar paraspinals using massage cream Acitve trigger point release to thoracic paraspinals    10/19/2023 Therex Nustep lvl 5 10 mins UE/LE  Supine lumbar trunk rotation 15 sec x 3 bilateral Supine SKTC stretch 15 sec X 5 bilat Supine bridge 3 sec hold x 15 Supine hooklying clamshell c isometric hold contralateral leg  blue band x 20 bilaterally Supine isometric hip flexion hold with contralateral UE 5 sec x 10 bilaterally Standing lumbar L stretch at counter X 10, holding 5 sec Standing lumbar extension AROM x 10  10/14/2023 Therex Nustep lvl 5 10 mins UE/LE  Standing lumbar extension AROM x 5 Supine lumbar trunk rotation 15 sec x 3 bilateral Supine bridge 2-3 sec hold x 15 Supine hooklying clamshell c isometric  hold contralateral leg blue band x 20 bilaterally Supine isometric hip flexion hold with contralateral UE 5 sec x 10 bilaterally  Additional time spent in review of existing HEP and cues for techniques.    TODAY'S TREATMENT:                                                       DATE:  10/06/2023 Creating, reviewing, and completing below HEP    PATIENT EDUCATION:  Education details: HEP update Person educated: Patient Education method: Medical illustrator Education comprehension: verbalized understanding and returned demonstration  HOME EXERCISE PROGRAM: Access Code: [email or web address] URL: [email or web address] Date: 10/14/2023 Prepared by: Chyrel Masson  Exercises - Supine Lower Trunk Rotation  - 2-3 x daily - 7 x weekly - 1 sets - 3-5 reps - 15 hold - Supine Single Knee to Chest Stretch  - 2-3 x daily - 7 x weekly - 1 sets - 3-5 reps - 15 hold - Supine Bridge  - 1-2 x daily - 7 x weekly - 1-2 sets - 10 reps - 2-3 hold - Standing Lumbar Extension with Counter  - 3-5 x daily - 7 x weekly - 1 sets - 5-10 reps  ASSESSMENT:  CLINICAL IMPRESSION:  Pt tolerating well and reporting less pain at end of session following manual therapy. Pt wishing to try DN at her next visit for active trigger point release. Continue skilled PT interventions.   OBJECTIVE IMPAIRMENTS: decreased activity tolerance, difficulty walking, decreased balance, decreased endurance, decreased mobility, decreased ROM, decreased strength, impaired flexibility, impaired UE/LE use, postural dysfunction, and pain.  ACTIVITY LIMITATIONS: bending, lifting, carry, locomotion, cleaning, community activity, driving, and or occupation  PERSONAL FACTORS: HTN, Motral regurgitation, tachycardia, osteopenia are also affecting patient's functional outcome.  REHAB POTENTIAL: Good  CLINICAL DECISION MAKING: Stable/uncomplicated  EVALUATION COMPLEXITY: Low    GOALS: Short term PT Goals Target date:  10/20/2023 Pt will be I and compliant with HEP. Baseline:  Goal status: on going 10/14/2023  Pt will decrease pain by 25% overall Baseline: Goal status: on going 10/14/2023  Long term PT goals Target date: 12/14/2023 Pt will improve lumbar flexion ROM to [facility] to improve functional mobility Baseline: Goal status: New Pt will improve  hip/knee strength to at least 5-/5 MMT to improve functional strength Baseline: Goal status: New Pt will improve FOTO to at least % functional to show improved function Baseline: Goal status: New Pt will reduce pain by overall 50% overall with usual activity Baseline: Goal status: New Pt will reduce pain to overall less than 2-3/10 with usual activity and work activity. Baseline: Goal status: New Pt will be able to ambulate at least 2 miles WNL gait pattern without complaints Baseline: Goal status: New  PLAN: PT FREQUENCY: 1-2 times per week   PT DURATION: 6-8 weeks  PLANNED INTERVENTIONS (unless contraindicated): aquatic PT, Canalith repositioning, cryotherapy, Electrical stimulation, Iontophoresis with 4 mg/ml dexamethasome, Moist  heat, traction, Ultrasound, gait training, Therapeutic exercise, balance training, neuromuscular re-education, patient/family education, prosthetic training, manual techniques, passive ROM, dry needling, taping, vasopnuematic device, vestibular, spinal manipulations, joint manipulations  PLAN FOR NEXT SESSION: Lumbar mobility and strength work as tolerated.  DN next visit  Narda Amber, PT, MPT 10/26/23 3:35 PM   10/26/23 3:35 PM

## 2023-11-02 ENCOUNTER — Ambulatory Visit: Payer: Medicare HMO | Admitting: Physical Therapy

## 2023-11-02 ENCOUNTER — Encounter: Payer: Self-pay | Admitting: Physical Therapy

## 2023-11-02 DIAGNOSIS — M6281 Muscle weakness (generalized): Secondary | ICD-10-CM

## 2023-11-02 DIAGNOSIS — R293 Abnormal posture: Secondary | ICD-10-CM | POA: Diagnosis not present

## 2023-11-02 DIAGNOSIS — M5459 Other low back pain: Secondary | ICD-10-CM | POA: Diagnosis not present

## 2023-11-02 NOTE — Therapy (Signed)
OUTPATIENT PHYSICAL THERAPY TREATMENT   Patient Name: Kelsey Elliott MRN: [id number] DOB:Oct 25, 1946, 77 y.o., female Today's Date: 11/02/2023  END OF SESSION:      Past Medical History:  Diagnosis Date   Abdominal pain    Allergic rhinitis    Atrophic vaginitis    Chest pain    Eczema    Elevated liver enzymes    Fatigue    Fatty liver    Headache    Hypercholesterolemia    Hypertension    Mitral regurgitation    mild by echo 01/2021   Osteopenia    Tachycardia    Past Surgical History:  Procedure Laterality Date   ABDOMINAL HYSTERECTOMY     GASTROC RECESSION EXTREMITY     SUSPENSIONPLASTY     TOE REPAIR     Patient Active Problem List   Diagnosis Date Noted   Sensorineural hearing loss, bilateral 10/05/2023   Impacted cerumen of both ears 10/05/2023   Positive ANA (antinuclear antibody) 08/31/2022   Osteoarthritis 08/31/2022   Lumbar radiculopathy 03/27/2022   Pain of left hip joint 03/10/2022   Sciatica 03/10/2022   Mitral regurgitation    Elevated liver enzymes 02/16/2013   Encounter for long-term (current) use of medications 02/16/2013   Hypercholesterolemia 02/16/2013   Foot pain, bilateral 07/12/2012   Metatarsalgia of both feet 07/12/2012    PCP: Merri Brunette, MD  REFERRING PROVIDER: Eldred Manges, MD  REFERRING DIAG: Chronic midline low back pain without sciatica [M54.50, G89.29]   Rationale for Evaluation and Treatment: Rehabilitation  THERAPY DIAG:  No diagnosis found.  ONSET DATE: several months.   SUBJECTIVE:                                                                                                                                                                                           SUBJECTIVE STATEMENT: Pt arriving today reporting 6/10 pain in her low back. Pt wishing to try DN this visit.   PERTINENT HISTORY:  HTN, Motral regurgitation, tachycardia, osteopenia  PAIN:  NPRS scale: 6/10 pain in her mid back Pain  location: lower back  Pain description: Dull ache, sharp pain when doing laundry, washing dishes.  Aggravating factors: Standing upright,  Relieving factors: Flexion,   PRECAUTIONS: Other: Tachycardia.   RED FLAGS: None   WEIGHT BEARING RESTRICTIONS: No  FALLS:  Has patient fallen in last 6 months? No  LIVING ENVIRONMENT: Lives with: lives with their family Lives in: House/apartment Stairs: 3 floors, with laundry in basement. Pt states that she is up and down a lot.  Has following equipment at home: None  OCCUPATION: Retired  PLOF: Independent  PATIENT GOALS: Pt would like to reduce pain. She would like to get back to walking.   NEXT MD VISIT: April [id number]  OBJECTIVE:  Note: Objective measures were completed at Evaluation unless otherwise noted.  DIAGNOSTIC FINDINGS:  10/06/2023 None recently of her vertebrae.   PATIENT SURVEYS:  10/06/2023 FOTO 47.1%, 59%  11/02/23: FOTO update 70%  SCREENING FOR RED FLAGS: Bowel or bladder incontinence: No  COGNITION: 10/06/2023 Overall cognitive status: Within functional limits for tasks assessed     SENSATION: 10/06/2023 [facility]   POSTURE:  10/06/2023 rounded shoulders, forward head, and decreased lumbar lordosis  PALPATION: 10/06/2023 Tenderness to palpation at lumbar paraspinals and multifidi.   LUMBAR ROM:   ROM AROM  10/14/2023  Flexion To ankles without normal back pian  Extension 50% WFL with backtightness  X5 in standing - improved to 75%   Right lateral flexion   Left lateral flexion   Right rotation   Left rotation    (Blank rows = not tested)  10/06/2023 Pt has functional ROM of her lumbar spine with exception of lumbar flexion. Pt performs a hip hinge with lumbar flexion.  (Blank rows = not tested)  LOWER EXTREMITY ROM:     Active  Right 10/06/2023 Left 10/06/2023  Hip flexion [facility]  Knee flexion [facility] WFL  Knee extension WFL WFL   (Blank rows = not tested)  LOWER EXTREMITY MMT:    MMT  Right 10/06/2023 Left 10/06/2023  Hip flexion 4 4  Knee flexion 4+ 4  Knee extension 4+ 4   (Blank rows = not tested)   FUNCTIONAL TESTS:  10/06/2023 5 times sit to stand: 11.76 sec  Timed up and go (TUG): 9.52 sec  GAIT: 10/06/2023 Distance walked: 50ft  Assistive device utilized: None Level of assistance: Complete Independence Comments: Pt ambulates with mild gait deficits.                    TODAY'S TREATMENT:                                                       DATE:   11/02/2023 Therex Nustep lvl 5 12 mins UE/LE  Lumbar extension c elbows on the wall x 10 holding 5-10 sec Standing hip extension: 2 x 10 bil LE Leg press: bil 75#, 2 x 15 Sit to stand x 10  Manual : Acitve trigger point release to thoracic paraspinals Trigger Point Dry-Needling  Treatment instructions: Expect mild to moderate muscle soreness. S/S of pneumothorax if dry needled over a lung field, and to seek immediate medical attention should they occur. Patient verbalized understanding of these instructions and education.  Patient Consent Given: Yes Education handout provided: Previously provided Muscles treated: bil lumbar paraspinals, left glute med and left piriformis Electrical stimulation performed: No Parameters: N/A Treatment response/outcome: twitch response noted and good response with less stiffness reported at end of treatment Modalities:  Moist heat 5 minutes following TPDN to lumbar paraspinals and left glutes    10/26/2023 Therex Nustep lvl 5 10 mins UE/LE  Lumbar extension c elbows on the wall x 10 holding 5-10 sec Standing hip extension: 2 x 10 bil LE Leg press: bil 75#, 2 x 15 Prone press ups: x 2 holding 20 sec Manual : STM to thoracic and lumbar paraspinals using massage cream Acitve trigger  point release to thoracic paraspinals    10/19/2023 Therex Nustep lvl 5 10 mins UE/LE  Supine lumbar trunk rotation 15 sec x 3 bilateral Supine SKTC stretch 15 sec X 5  bilat Supine bridge 3 sec hold x 15 Supine hooklying clamshell c isometric hold contralateral leg blue band x 20 bilaterally Supine isometric hip flexion hold with contralateral UE 5 sec x 10 bilaterally Standing lumbar L stretch at counter X 10, holding 5 sec Standing lumbar extension AROM x 10  10/14/2023 Therex Nustep lvl 5 10 mins UE/LE  Standing lumbar extension AROM x 5 Supine lumbar trunk rotation 15 sec x 3 bilateral Supine bridge 2-3 sec hold x 15 Supine hooklying clamshell c isometric hold contralateral leg blue band x 20 bilaterally Supine isometric hip flexion hold with contralateral UE 5 sec x 10 bilaterally  Additional time spent in review of existing HEP and cues for techniques.    TODAY'S TREATMENT:                                                       DATE:  10/06/2023 Creating, reviewing, and completing below HEP    PATIENT EDUCATION:  Education details: HEP update Person educated: Patient Education method: Medical illustrator Education comprehension: verbalized understanding and returned demonstration  HOME EXERCISE PROGRAM: Access Code: [email or web address] URL: [email or web address] Date: 10/14/2023 Prepared by: Chyrel Masson  Exercises - Supine Lower Trunk Rotation  - 2-3 x daily - 7 x weekly - 1 sets - 3-5 reps - 15 hold - Supine Single Knee to Chest Stretch  - 2-3 x daily - 7 x weekly - 1 sets - 3-5 reps - 15 hold - Supine Bridge  - 1-2 x daily - 7 x weekly - 1-2 sets - 10 reps - 2-3 hold - Standing Lumbar Extension with Counter  - 3-5 x daily - 7 x weekly - 1 sets - 5-10 reps  ASSESSMENT:  CLINICAL IMPRESSION:  Pt tolerating all exercises well. Pt with good tolerance to TPDN this visit and pt reporting less pain and stiffness at end of session. Recommend continued skilled PT interventions as necessary to progress toward goals set.   OBJECTIVE IMPAIRMENTS: decreased activity tolerance, difficulty walking, decreased balance, decreased  endurance, decreased mobility, decreased ROM, decreased strength, impaired flexibility, impaired UE/LE use, postural dysfunction, and pain.  ACTIVITY LIMITATIONS: bending, lifting, carry, locomotion, cleaning, community activity, driving, and or occupation  PERSONAL FACTORS: HTN, Motral regurgitation, tachycardia, osteopenia are also affecting patient's functional outcome.  REHAB POTENTIAL: Good  CLINICAL DECISION MAKING: Stable/uncomplicated  EVALUATION COMPLEXITY: Low    GOALS: Short term PT Goals Target date: 10/20/2023 Pt will be I and compliant with HEP. Baseline:  Goal status: MET  Pt will decrease pain by 25% overall Baseline: Goal status: MET    Long term PT goals Target date: 12/14/2023 Pt will improve lumbar flexion ROM to [facility] to improve functional mobility Baseline: Goal status: New Pt will improve  hip/knee strength to at least 5-/5 MMT to improve functional strength Baseline: Goal status: New Pt will improve FOTO to at least % functional to show improved function Baseline: Goal status: MET 11/02/23 Pt will reduce pain by overall 50% overall with usual activity Baseline: Goal status: New Pt will reduce pain to overall less than 2-3/10 with usual activity and work activity. Baseline: Goal status:  New Pt will be able to ambulate at least 2 miles WNL gait pattern without complaints Baseline: Goal status: New  PLAN: PT FREQUENCY: 1-2 times per week   PT DURATION: 6-8 weeks  PLANNED INTERVENTIONS (unless contraindicated): aquatic PT, Canalith repositioning, cryotherapy, Electrical stimulation, Iontophoresis with 4 mg/ml dexamethasome, Moist heat, traction, Ultrasound, gait training, Therapeutic exercise, balance training, neuromuscular re-education, patient/family education, prosthetic training, manual techniques, passive ROM, dry needling, taping, vasopnuematic device, vestibular, spinal manipulations, joint manipulations  PLAN FOR NEXT SESSION: Lumbar  mobility and strength work as tolerated.  Assess response to DN  Narda Amber, PT, MPT 11/02/23 2:34 PM   11/02/23 2:34 PM

## 2023-11-12 ENCOUNTER — Other Ambulatory Visit (HOSPITAL_BASED_OUTPATIENT_CLINIC_OR_DEPARTMENT_OTHER): Payer: Self-pay

## 2023-11-12 MED ORDER — AREXVY 120 MCG/0.5ML IM SUSR
0.5000 mL | Freq: Once | INTRAMUSCULAR | 0 refills | Status: AC
  Filled 2023-11-12: qty 0.5, 1d supply, fill #0

## 2023-11-17 DIAGNOSIS — D2271 Melanocytic nevi of right lower limb, including hip: Secondary | ICD-10-CM | POA: Diagnosis not present

## 2023-11-17 DIAGNOSIS — L821 Other seborrheic keratosis: Secondary | ICD-10-CM | POA: Diagnosis not present

## 2023-11-17 DIAGNOSIS — D485 Neoplasm of uncertain behavior of skin: Secondary | ICD-10-CM | POA: Diagnosis not present

## 2024-01-04 DIAGNOSIS — K080 Exfoliation of teeth due to systemic causes: Secondary | ICD-10-CM | POA: Diagnosis not present

## 2024-01-05 DIAGNOSIS — M810 Age-related osteoporosis without current pathological fracture: Secondary | ICD-10-CM | POA: Diagnosis not present

## 2024-02-21 DIAGNOSIS — K080 Exfoliation of teeth due to systemic causes: Secondary | ICD-10-CM | POA: Diagnosis not present

## 2024-02-25 ENCOUNTER — Other Ambulatory Visit (HOSPITAL_BASED_OUTPATIENT_CLINIC_OR_DEPARTMENT_OTHER): Payer: Self-pay

## 2024-02-25 MED ORDER — COVID-19 MRNA VAC-TRIS(PFIZER) 30 MCG/0.3ML IM SUSY
0.3000 mL | PREFILLED_SYRINGE | Freq: Once | INTRAMUSCULAR | 0 refills | Status: AC
  Filled 2024-02-25: qty 0.3, 1d supply, fill #0

## 2024-02-28 DIAGNOSIS — Z1231 Encounter for screening mammogram for malignant neoplasm of breast: Secondary | ICD-10-CM | POA: Diagnosis not present

## 2024-03-23 ENCOUNTER — Telehealth: Payer: Self-pay

## 2024-03-23 NOTE — Telephone Encounter (Signed)
 Copied from CRM [id number]. Topic: Clinical - Request for Lab/Test Order >> Mar 23, 2024  1:19 PM Chantha C wrote: Reason for CRM: Claris Gower from [facility] [phone] ext [phone] needs a PA for CT chest high resolution, patient has an appointment for 03/28/24 at 12:45 pm. Please advise and call back.  Routing to [facility]

## 2024-03-24 NOTE — Telephone Encounter (Signed)
 Called and spoke to Kelsey Elliott the Berkley Harvey is in there

## 2024-03-28 ENCOUNTER — Ambulatory Visit
Admission: RE | Admit: 2024-03-28 | Discharge: 2024-03-28 | Disposition: A | Discharge status: Home / Self Care | Source: Ambulatory Visit | Attending: Pulmonary Disease | Admitting: Pulmonary Disease

## 2024-03-28 DIAGNOSIS — I700 Atherosclerosis of aorta: Secondary | ICD-10-CM | POA: Diagnosis not present

## 2024-03-28 DIAGNOSIS — J8410 Pulmonary fibrosis, unspecified: Secondary | ICD-10-CM | POA: Diagnosis not present

## 2024-03-28 DIAGNOSIS — I2510 Atherosclerotic heart disease of native coronary artery without angina pectoris: Secondary | ICD-10-CM | POA: Diagnosis not present

## 2024-03-28 DIAGNOSIS — J849 Interstitial pulmonary disease, unspecified: Secondary | ICD-10-CM

## 2024-04-03 DIAGNOSIS — I10 Essential (primary) hypertension: Secondary | ICD-10-CM | POA: Diagnosis not present

## 2024-04-03 DIAGNOSIS — E7800 Pure hypercholesterolemia, unspecified: Secondary | ICD-10-CM | POA: Diagnosis not present

## 2024-04-03 DIAGNOSIS — R946 Abnormal results of thyroid function studies: Secondary | ICD-10-CM | POA: Diagnosis not present

## 2024-04-10 DIAGNOSIS — I700 Atherosclerosis of aorta: Secondary | ICD-10-CM | POA: Diagnosis not present

## 2024-04-10 DIAGNOSIS — M8000XG Age-related osteoporosis with current pathological fracture, unspecified site, subsequent encounter for fracture with delayed healing: Secondary | ICD-10-CM | POA: Diagnosis not present

## 2024-04-10 DIAGNOSIS — Z0000 Encounter for general adult medical examination without abnormal findings: Secondary | ICD-10-CM | POA: Diagnosis not present

## 2024-04-10 DIAGNOSIS — I10 Essential (primary) hypertension: Secondary | ICD-10-CM | POA: Diagnosis not present

## 2024-04-10 DIAGNOSIS — I2510 Atherosclerotic heart disease of native coronary artery without angina pectoris: Secondary | ICD-10-CM | POA: Diagnosis not present

## 2024-04-20 ENCOUNTER — Encounter: Payer: Self-pay | Admitting: Pulmonary Disease

## 2024-04-20 ENCOUNTER — Ambulatory Visit: Admitting: Pulmonary Disease

## 2024-04-20 VITALS — BP 119/70 | HR 63 | Ht 61.0 in | Wt 158.0 lb

## 2024-04-20 DIAGNOSIS — Z87891 Personal history of nicotine dependence: Secondary | ICD-10-CM

## 2024-04-20 DIAGNOSIS — J849 Interstitial pulmonary disease, unspecified: Secondary | ICD-10-CM

## 2024-04-20 NOTE — Progress Notes (Signed)
 Kelsey Elliott    [id number]    February 22, 1946  Primary Care Physician:Pharr, Marlou Sims, MD  Referring Physician: Imelda Man, MD [address] [place],  [place] [address]  Chief complaint: Follow up for interstitial lung disease  HPI: 78 y.o. with history of hypertension, hyperlipidemia, allergies, sleep apnea.  She has been referred for evaluation of abnormal cardiac CT scan showing interstitial lung disease. She has seasonal allergies, obstructive sleep apnea on CPAP for many years without issue.  No complaints pertaining to the lung.  Denies any dyspnea, cough, sputum production  Saw Dr. 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  No mold, hot tub, Jacuzzi ILD questionnaire 06/11/2022-down exposure as above.  All other exposures are negative Smoking history: 8-pack-year smoker.  Quit in 1973 Travel history: No significant travel history Relevant family history: No family history of lung disease  Interim history: Discussed the use of AI scribe software for clinical note transcription with the patient, who gave verbal consent to proceed.  History of Present Illness Kelsey Elliott is a 78 year old female with interstitial lung disease who presents for a follow-up on her lung condition.  She feels generally well but experiences increased postnasal drainage during the pollen season, which is typical for her. No shortness of breath is noted.  Her lung function tests in 2023 were stable. Recent imaging indicates slight changes at the base of her lungs, which may suggest inflammation or atelectasis. She has a history of positive SSP antibodies, previously evaluated with no autoimmune disease identified.  She continues to use a down comforter, although her pillows have been switched to foam.     Outpatient Encounter Medications as of 04/20/2024  Medication Sig   Ascorbic Acid (VITAMIN C) 1000 MG tablet Take 1,000 mg by mouth daily.   cholecalciferol (VITAMIN D3) 25 MCG (1000 UNIT) tablet Take 1,000 Units by mouth daily.   COVID-19 mRNA bivalent vaccine, Pfizer, (PFIZER COVID-19 VAC BIVALENT) injection Inject into the muscle.   COVID-19 mRNA vaccine 2024-2025 (COMIRNATY ) syringe Inject into the muscle.   COVID-19 mRNA vaccine, Pfizer, 30 MCG/0.3ML injection Inject into the muscle.   denosumab  (PROLIA ) 60 MG/ML SOSY injection Inject 60 mg into the skin every 6 (six) months.   ibuprofen  (ADVIL ) 200 MG tablet Take 400 mg by mouth every 6 (six) hours as needed for moderate pain.   influenza vaccine adjuvanted (FLUAD) 0.5 ML injection Inject into the muscle.   influenza vaccine adjuvanted (FLUAD) 0.5 ML injection Inject into the muscle.   LOSARTAN POTASSIUM PO Take 50 mg by mouth.   metoprolol tartrate (LOPRESSOR) 25 MG tablet Take 25 mg by mouth 2 (two) times daily.   Multiple Vitamin (MULTIVITAMIN WITH MINERALS) TABS tablet Take 1 tablet by mouth daily.   No facility-administered encounter medications on file as of 04/20/2024.   Physical Exam: Blood pressure 132/70, pulse 74, temperature 98.3 F (36.8 C), temperature source Oral, height 5\' 1"  (1.549 m), weight 153 lb (69.4 kg), SpO2 97 %. Gen:      No acute distress HEENT:  EOMI, sclera anicteric Neck:     No masses; no thyromegaly Lungs:    Clear to auscultation bilaterally; normal respiratory effort CV:         Regular rate and rhythm; no murmurs Abd:      +  bowel sounds; soft, non-tender; no palpable masses, no distension Ext:    No edema; adequate peripheral perfusion Skin:      Warm and dry; no rash Neuro: alert and oriented x 3 Psych: normal mood and affect   Data Reviewed: Imaging:  Cardiac CT 04/29/2022-visualized lungs show groundglass opacities, thickening of the peribronchovascular interstitium  High-resolution CT  07/08/2022-mild basilar peripheral opacities with linear scarring.  Alternate diagnosis.  High resolution CT 07/06/2023-mild patchy air trapping with some improvement.  Interstitial lung disease is stable.  High resolution CT 04/09/2024-increasing bibasal groundglass, subpleural reticular densities, bronchiolectasis.  Worsening compared to 2024 I reviewed images personally  PFTs: 08/12/2022 FVC 2.30 [91%], FEV1 1.84 [102%], F/F 84, TLC 4.20 [91%], DLCO 14.92 [86%] Normal test  Labs: Autoimmune serologies 06/09/2022-positive only for ANA 1: 40 nuclear homogeneous, SSB 2.6 Hypersensitivity panel 06/09/2022-negative  Assessment & Plan Interstitial lung disease Review of the scan shows some vague interstitial changes in a nonspecific pattern.  Findings are not really suggestive of interstitial lung disease and may be postinflammatory scarring though she denies any pneumonia or COVID infection.  Serologies are negative except for borderline ANA and positive SSB.  Finished rheumatology evaluation and not felt to have any autoimmune process.  She has been previously reluctant to get rid of her down pillows and comforters  Recent CT scan shows slight worsening changes at the lung bases, suggesting inflammation or atelectasis. She is asymptomatic with no dyspnea. Previous lung function tests were stable. Differential diagnosis includes down exposure, although CT findings are atypical for this.  - Order repeat CT scan with prone and supine images in six months with prone images to assess for atelectasis - Conduct lung function test in six months - Advise discontinuation of down comforter and use of down alternatives to potentially stabilize or improve lung condition   Plan/Recommendations: Follow-up high-res CT with prone and supine images PFTs  Phyllis Breeze MD [facility] 04/20/2024, 11:07 AM  CC: Imelda Man, MD

## 2024-04-20 NOTE — Patient Instructions (Signed)
 VISIT SUMMARY:  Kelsey Elliott, you had a follow-up appointment today to discuss your interstitial lung disease. You reported feeling generally well, with increased postnasal drainage during pollen season but no shortness of breath. Your recent imaging showed slight changes at the base of your lungs, which may suggest inflammation or atelectasis. Your lung function tests from 2023 were stable, and no autoimmune disease was identified despite positive SSP antibodies.  YOUR PLAN:  -INTERSTITIAL LUNG DISEASE: Interstitial lung disease refers to a group of disorders that cause scarring of the lungs, which can lead to breathing problems. Your recent CT scan showed slight changes at the base of your lungs, which may suggest inflammation or atelectasis (partial collapse of the lung). Although you are currently asymptomatic, we will monitor your condition closely. You should discontinue using your down comforter and switch to down alternatives to potentially stabilize or improve your lung condition. We will repeat the CT scan in six months with prone images to assess for atelectasis and conduct another lung function test in six months.  INSTRUCTIONS:  Please schedule a repeat CT scan with prone images and a lung function test in six months. Discontinue using your down comforter and switch to down alternatives.

## 2024-06-06 ENCOUNTER — Telehealth (HOSPITAL_BASED_OUTPATIENT_CLINIC_OR_DEPARTMENT_OTHER): Payer: Self-pay

## 2024-06-06 NOTE — Telephone Encounter (Signed)
 Copied from CRM [id number]. Topic: Clinical - Medication Question >> Jun 05, 2024  4:07 PM Rilla B wrote: Reason for CRM: Patient has tested positive for COVID, primary care doctor suggested calling Dr Theophilus for a Paxlovid script for treatment. Please call.

## 2024-06-07 ENCOUNTER — Telehealth: Payer: Self-pay | Admitting: Pulmonary Disease

## 2024-06-07 ENCOUNTER — Telehealth: Payer: Self-pay

## 2024-06-07 MED ORDER — PAXLOVID (300/100) 20 X 150 MG & 10 X 100MG PO TBPK: 3.0000 | ORAL_TABLET | Freq: Two times a day (BID) | ORAL | 0 refills | Status: AC

## 2024-06-07 NOTE — Telephone Encounter (Signed)
Lm x1 for pt.

## 2024-06-07 NOTE — Addendum Note (Signed)
 Addended byBETHA THEOPHILUS ROOSEVELT on: 06/07/2024 08:40 AM   Modules accepted: Orders

## 2024-06-07 NOTE — Telephone Encounter (Signed)
 PT ret Margie's call and I read what Dr. Theophilus said. She will call if the meds do not seem to help. She accepted what was read word for word. NFN at this time.

## 2024-06-07 NOTE — Telephone Encounter (Signed)
 Prescription for Paxlovid has been sent to pharmacy.  Please inform the patient. If she is not feeling well we can do a virtual visit on Friday afternoon

## 2024-06-07 NOTE — Telephone Encounter (Signed)
 Copied from CRM [id number]. Topic: Clinical - Medication Question >> Jun 05, 2024  4:07 PM Rilla B wrote: Reason for CRM: Patient has tested positive for COVID, primary care doctor suggested calling Dr Theophilus for a Paxlovid script for treatment. Please call. >> Jun 07, 2024  2:30 PM Chantha C wrote: Patient is returning the office call, no further information given. Warm transferred CAL.   Pt was Rx Paxlovid today, 06-07-24, by Dr Theophilus. Please see telephone encounter from 06-06-24. NFN

## 2024-06-07 NOTE — Telephone Encounter (Signed)
Please see duplicate phone note.  

## 2024-06-07 NOTE — Telephone Encounter (Signed)
 Noted

## 2024-07-05 DIAGNOSIS — K080 Exfoliation of teeth due to systemic causes: Secondary | ICD-10-CM | POA: Diagnosis not present

## 2024-07-05 DIAGNOSIS — M810 Age-related osteoporosis without current pathological fracture: Secondary | ICD-10-CM | POA: Diagnosis not present

## 2024-07-20 DIAGNOSIS — M810 Age-related osteoporosis without current pathological fracture: Secondary | ICD-10-CM | POA: Diagnosis not present

## 2024-09-15 ENCOUNTER — Other Ambulatory Visit (HOSPITAL_BASED_OUTPATIENT_CLINIC_OR_DEPARTMENT_OTHER): Payer: Self-pay

## 2024-09-15 DIAGNOSIS — H40013 Open angle with borderline findings, low risk, bilateral: Secondary | ICD-10-CM | POA: Diagnosis not present

## 2024-09-15 DIAGNOSIS — Z961 Presence of intraocular lens: Secondary | ICD-10-CM | POA: Diagnosis not present

## 2024-09-15 MED ORDER — FLUZONE HIGH-DOSE 0.5 ML IM SUSY
0.5000 mL | PREFILLED_SYRINGE | Freq: Once | INTRAMUSCULAR | 0 refills | Status: AC
  Filled 2024-09-15: qty 0.5, 1d supply, fill #0

## 2024-09-15 MED ORDER — COMIRNATY 30 MCG/0.3ML IM SUSY
0.3000 mL | PREFILLED_SYRINGE | Freq: Once | INTRAMUSCULAR | 0 refills | Status: AC
  Filled 2024-09-15: qty 0.3, 1d supply, fill #0

## 2024-09-29 DIAGNOSIS — E7800 Pure hypercholesterolemia, unspecified: Secondary | ICD-10-CM | POA: Diagnosis not present

## 2024-09-29 DIAGNOSIS — E559 Vitamin D deficiency, unspecified: Secondary | ICD-10-CM | POA: Diagnosis not present

## 2024-09-29 DIAGNOSIS — Z0000 Encounter for general adult medical examination without abnormal findings: Secondary | ICD-10-CM | POA: Diagnosis not present

## 2024-10-03 ENCOUNTER — Telehealth: Payer: Self-pay | Admitting: Pulmonary Disease

## 2024-10-03 NOTE — Telephone Encounter (Signed)
 Copied from CRM #[id number]. Topic: Appointments - Scheduling Inquiry for Clinic >> Oct 03, 2024 10:42 AM Kelsey Elliott wrote: Reason for CRM: Pt called to schedule her PFT but for some reason it will not allow me to schedule the appt and it does say she needs it done before her follow up appt in November. Thanks   Attempted to call patient. Left voicemail for patient to call to get scheduled for PFT.

## 2024-10-04 NOTE — Telephone Encounter (Signed)
 PFT now scheduled 11/12 at [facility].

## 2024-10-10 DIAGNOSIS — M8000XG Age-related osteoporosis with current pathological fracture, unspecified site, subsequent encounter for fracture with delayed healing: Secondary | ICD-10-CM | POA: Diagnosis not present

## 2024-10-10 DIAGNOSIS — I700 Atherosclerosis of aorta: Secondary | ICD-10-CM | POA: Diagnosis not present

## 2024-10-10 DIAGNOSIS — I10 Essential (primary) hypertension: Secondary | ICD-10-CM | POA: Diagnosis not present

## 2024-10-10 DIAGNOSIS — E559 Vitamin D deficiency, unspecified: Secondary | ICD-10-CM | POA: Diagnosis not present

## 2024-10-10 DIAGNOSIS — Z23 Encounter for immunization: Secondary | ICD-10-CM | POA: Diagnosis not present

## 2024-10-10 DIAGNOSIS — I2510 Atherosclerotic heart disease of native coronary artery without angina pectoris: Secondary | ICD-10-CM | POA: Diagnosis not present

## 2024-10-16 ENCOUNTER — Ambulatory Visit (INDEPENDENT_AMBULATORY_CARE_PROVIDER_SITE_OTHER): Admitting: Audiology

## 2024-10-16 ENCOUNTER — Encounter (INDEPENDENT_AMBULATORY_CARE_PROVIDER_SITE_OTHER): Payer: Self-pay | Admitting: Otolaryngology

## 2024-10-16 ENCOUNTER — Ambulatory Visit (INDEPENDENT_AMBULATORY_CARE_PROVIDER_SITE_OTHER): Admitting: Otolaryngology

## 2024-10-16 VITALS — BP 139/77 | HR 81 | Temp 97.5°F

## 2024-10-16 DIAGNOSIS — H903 Sensorineural hearing loss, bilateral: Secondary | ICD-10-CM

## 2024-10-16 DIAGNOSIS — H6123 Impacted cerumen, bilateral: Secondary | ICD-10-CM | POA: Diagnosis not present

## 2024-10-16 NOTE — Progress Notes (Signed)
 Patient ID: Kelsey Elliott, female   DOB: May 30, 1946, 78 y.o.   MRN: [id number]  Follow-up: Bilateral sensorineural hearing loss  HPI: The patient is a 78 year old female who returns today for her yearly follow-up evaluation.  The patient has a history of bilateral high-frequency sensorineural hearing loss, slightly worse on the left side.  She was previously fitted with bilateral hearing aids.  The patient returns today complaining of worsening of her high-frequency hearing.  She denies any otalgia, otorrhea, or vertigo.  She is using her hearing aids daily.  Exam: General: Communicates without difficulty, well nourished, no acute distress. Head: Normocephalic, no evidence injury, no tenderness, facial buttresses intact without stepoff. Face/sinus: No tenderness to palpation and percussion. Facial movement is normal and symmetric. Eyes: PERRL, EOMI. No scleral icterus, conjunctivae clear. Neuro: CN II exam reveals vision grossly intact.  No nystagmus at any point of gaze. Ears: Auricles well formed without lesions.  Bilateral cerumen impaction.  Nose: External evaluation reveals normal support and skin without lesions.  Dorsum is intact.  Anterior rhinoscopy reveals congested mucosa over anterior aspect of inferior turbinates and intact septum.  No purulence noted. Oral:  Oral cavity and oropharynx are intact, symmetric, without erythema or edema.  Mucosa is moist without lesions. Neck: Full range of motion without pain.  There is no significant lymphadenopathy.  No masses palpable.  Thyroid  bed within normal limits to palpation.  Parotid glands and submandibular glands equal bilaterally without mass.  Trachea is midline. Neuro:  CN 2-12 grossly intact.   Procedure: Bilateral cerumen disimpaction Anesthesia: None Description: Under the operating microscope, the cerumen is carefully removed with a combination of cerumen currette, alligator forceps, and suction catheters.  After the cerumen is removed, the TMs  are noted to be normal.  No mass, erythema, or lesions. The patient tolerated the procedure well.    Her hearing test shows bilateral high-frequency sensorineural hearing loss, slightly worse on the left side.  Assessment: 1.  Bilateral cerumen impaction.  After the disimpaction procedure, her tympanic membrane's and middle ear spaces are noted to be normal. 2.  Stable bilateral high-frequency sensorineural hearing loss, slightly worse on the left side.     Plan: 1.  Otomicroscopy with bilateral cerumen disimpaction. 2.  The physical exam findings and the hearing test results are reviewed with the patient. 3.  The patient should continue the use of her hearing aids. 4.  The patient will return for reevaluation in 1 year.

## 2024-10-16 NOTE — Progress Notes (Signed)
  [address] [place], [place] [address] [phone]  Audiological Evaluation    Name: Kelsey Elliott     DOB:   [birth year]      MRN:   [id number]                                                                                     Service Date: 10/16/2024     Accompanied by: unaccompanied   Patient comes today after Dr. Karis, ENT sent a referral for a hearing evaluation due to concerns with hearing loss.   Symptoms Yes Details  Hearing loss  [x]  Perceives struggles hearing more than before  Tinnitus  []    Ear pain/ infections/pressure  []    Balance problems  []    Noise exposure history  []    Previous ear surgeries  []    Family history of hearing loss  []    Amplification  [x]  Hearing aids from [facility]  Other  []      Otoscopy: Right ear: Non-occluding cerumen, able to visualize some tympanic membrane landmarks. Left ear:  Non-occluding cerumen, able to visualize some tympanic membrane landmarks.  Tympanometry: Right ear: Normal external ear canal volume with normal middle ear pressure and low tympanic membrane compliance (Type As). Left ear: Normal external ear canal volume with normal middle ear pressure and tympanic membrane compliance (Type A). Findings are suggestive of normal middle ear function.  Hearing Evaluation The hearing test results were completed under headphones and results are deemed to be of good reliability. Test technique:  conventional    Pure tone Audiometry: Right ear- Borderline normal to severe  sensorineural hearing loss from 125 Hz - 8000 Hz. Left ear-  Borderline normal to severe  sensorineural hearing loss from 125 Hz - 8000 Hz.  Speech Audiometry: Right ear- Speech Reception Threshold (SRT) was obtained at 35 dBHL. Left ear-Speech Reception Threshold (SRT) was obtained at 35 dBHL.   Word Recognition Score Tested using NU-6 (recorded) Right ear: 92% was obtained at a presentation level of 80 dBHL with contralateral masking which is  deemed as  excellent. Left ear: 96% was obtained at a presentation level of 80 dBHL with contralateral masking which is deemed as  excellent.   Impression: There is a not significant difference in pure-tone thresholds between ears.   Recommendations: Follow up with ENT as scheduled for today. Return for a hearing evaluation if concerns with hearing changes arise or per MD recommendation. Recommend a hearing aid check with their audiologist or hearing aid dispenser.   Farmer Mccahill MARIE LEROUX-MARTINEZ, AUD

## 2024-10-19 ENCOUNTER — Telehealth: Payer: Self-pay

## 2024-10-19 NOTE — Telephone Encounter (Signed)
 She needs a high resolution CT which is done without contrast.  Looks like it has been scheduled properly Can we call [facility] imaging and request supine and prone images with the CT.

## 2024-10-19 NOTE — Telephone Encounter (Signed)
 Copied from CRM [id number]. Topic: General - Other >> Oct 19, 2024 11:41 AM Rilla NOVAK wrote: Reason for CRM: Patient is scheduled for a CT tomorrow @ [facility].  She was told the CT is without contrast and Dr Theophilus was adamant that she have CT with contrast. Patient needs office to confirm order that imaging has (I do not see w/o contrast) and please call her and confirm @ [phone]. >> Oct 19, 2024  1:33 PM CMA Silvano SQUIBB wrote: Please advise if Dr. Theophilus intended for this CT to be with or without contrast.  CT w/out was ordered & scheduled.  If CT w/out is needed, pt will need to be rescheduled.    Dr Theophilus please advise correct CT

## 2024-10-20 ENCOUNTER — Ambulatory Visit
Admission: RE | Admit: 2024-10-20 | Discharge: 2024-10-20 | Disposition: A | Discharge status: Home / Self Care | Source: Ambulatory Visit | Attending: Pulmonary Disease | Admitting: Pulmonary Disease

## 2024-10-20 DIAGNOSIS — J849 Interstitial pulmonary disease, unspecified: Secondary | ICD-10-CM | POA: Diagnosis not present

## 2024-10-20 NOTE — Telephone Encounter (Signed)
 ATC pt X1. LMTCB  I called [facility] to make sure the imagines will be preformed in supine and prone. Sonny with [facility] did confirm that it will be performed in Prone and Supine. NFN

## 2024-10-25 ENCOUNTER — Ambulatory Visit: Admitting: Pulmonary Disease

## 2024-10-25 ENCOUNTER — Encounter: Payer: Self-pay | Admitting: Pulmonary Disease

## 2024-10-25 ENCOUNTER — Ambulatory Visit (INDEPENDENT_AMBULATORY_CARE_PROVIDER_SITE_OTHER)

## 2024-10-25 VITALS — BP 136/84 | HR 73 | Temp 98.9°F | Ht 61.0 in | Wt 157.8 lb

## 2024-10-25 DIAGNOSIS — Z8616 Personal history of COVID-19: Secondary | ICD-10-CM

## 2024-10-25 DIAGNOSIS — Z87891 Personal history of nicotine dependence: Secondary | ICD-10-CM

## 2024-10-25 DIAGNOSIS — J849 Interstitial pulmonary disease, unspecified: Secondary | ICD-10-CM | POA: Diagnosis not present

## 2024-10-25 DIAGNOSIS — R0982 Postnasal drip: Secondary | ICD-10-CM

## 2024-10-25 LAB — PULMONARY FUNCTION TEST
DL/VA % pred: 87 %
DL/VA: 3.66 ml/min/mmHg/L
DLCO unc % pred: 72 %
DLCO unc: 12.41 ml/min/mmHg
FEF 25-75 Post: 1.83 L/s
FEF 25-75 Pre: 1.63 L/s
FEF2575-%Change-Post: 12 %
FEF2575-%Pred-Post: 136 %
FEF2575-%Pred-Pre: 121 %
FEV1-%Change-Post: 1 %
FEV1-%Pred-Post: 96 %
FEV1-%Pred-Pre: 94 %
FEV1-Post: 1.66 L
FEV1-Pre: 1.63 L
FEV1FVC-%Change-Post: 5 %
FEV1FVC-%Pred-Pre: 109 %
FEV6-%Change-Post: -3 %
FEV6-%Pred-Post: 88 %
FEV6-%Pred-Pre: 91 %
FEV6-Post: 1.94 L
FEV6-Pre: 2.02 L
FEV6FVC-%Pred-Post: 105 %
FEV6FVC-%Pred-Pre: 105 %
FVC-%Change-Post: -3 %
FVC-%Pred-Post: 83 %
FVC-%Pred-Pre: 86 %
FVC-Post: 1.94 L
FVC-Pre: 2.02 L
Post FEV1/FVC ratio: 86 %
Post FEV6/FVC ratio: 100 %
Pre FEV1/FVC ratio: 81 %
Pre FEV6/FVC Ratio: 100 %
RV % pred: 105 %
RV: 2.32 L
TLC % pred: 94 %
TLC: 4.36 L

## 2024-10-25 NOTE — Progress Notes (Signed)
 Full PFT performed today.

## 2024-10-25 NOTE — Patient Instructions (Signed)
  VISIT SUMMARY: You had a follow-up visit to check on your interstitial lung disease. Your breathing has been stable, but you have ongoing throat irritation from postnasal drip. You also had COVID-19 in June, which may have affected your lungs.  YOUR PLAN: INTERSTITIAL LUNG DISEASE: Your lung condition shows slight changes on the CT scan, possibly due to past exposure to down pillows and your recent COVID-19 infection. Your lung function tests show mixed results, with some improvement in lung volumes but worsening in diffusion capacity. -We will order another CT scan and lung function test in six months to monitor your condition. -You are encouraged to engage in aerobic exercises such as walking, using a treadmill, or cycling. -We will consider medication if your condition progresses despite avoiding exposures.  POSTNASAL DRIP: You have ongoing postnasal drip that is causing throat irritation. -Continue to monitor your symptoms and manage them conservatively for now.

## 2024-10-25 NOTE — Progress Notes (Signed)
 Kelsey Elliott    [id number]    07/22/1946  Primary Care Physician:Pharr, Ryan, MD  Referring Physician: Clarice Ryan, MD [address] [place],  [place] [address]  Chief complaint: Follow up for interstitial lung disease  HPI: 78 y.o. with history of hypertension, hyperlipidemia, allergies, sleep apnea.  She has been referred for evaluation of abnormal cardiac CT scan showing interstitial lung disease. She has seasonal allergies, obstructive sleep apnea on CPAP for many years without issue.  No complaints pertaining to the lung.  Denies any dyspnea, cough, sputum production  Saw Dr. Jeannetta, rheumatology in September 2023 for elevated SSA and not felt to have any autoimmune disease  Interim history: Discussed the use of AI scribe software for clinical note transcription with the patient, who gave verbal consent to proceed.  History of Present Illness  Interim history: Kelsey Elliott is a 78 year old female with interstitial lung disease who presents for follow-up of her lung condition.  Respiratory symptoms and disease trajectory - Interstitial lung disease with stable breathing since last visit - Ongoing postnasal drip causing throat irritation - No use of inhalers - No current tobacco use; quit smoking in 1973  Environmental exposures - Removed down pillows and comforters from home in May due to concern for respiratory irritation  Recent infectious disease - Contracted COVID-19 in June 2025 - Treated with Paxlovid   Exercise and pulmonary rehabilitation - Inquires about exercises to improve respiratory condition - Past yoga practice found beneficial for breathing   Relevant pulmonary history Pets: Has a cat Occupation: Retired programmer, systems Exposures: 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  She got rid of it in 2025.  No mold, hot tub, Jacuzzi ILD questionnaire 06/11/2022-down exposure as above.  All other exposures are  negative Smoking history: 8-pack-year smoker.  Quit in 1973 Travel history: No significant travel history Relevant family history: No family history of lung disease   Outpatient Encounter Medications as of 10/25/2024  Medication Sig   Ascorbic Acid (VITAMIN C) 1000 MG tablet Take 1,000 mg by mouth daily.   cholecalciferol (VITAMIN D3) 25 MCG (1000 UNIT) tablet Take 1,000 Units by mouth daily.   denosumab  (PROLIA ) 60 MG/ML SOSY injection Inject 60 mg into the skin every 6 (six) months.   ibuprofen  (ADVIL ) 200 MG tablet Take 400 mg by mouth every 6 (six) hours as needed for moderate pain.   LOSARTAN POTASSIUM PO Take 50 mg by mouth.   metoprolol tartrate (LOPRESSOR) 25 MG tablet Take 25 mg by mouth 2 (two) times daily.   Multiple Vitamin (MULTIVITAMIN WITH MINERALS) TABS tablet Take 1 tablet by mouth daily.   COVID-19 mRNA bivalent vaccine, Pfizer, (PFIZER COVID-19 VAC BIVALENT) injection Inject into the muscle. (Patient not taking: Reported on 10/25/2024)   COVID-19 mRNA vaccine 2024-2025 (COMIRNATY ) syringe Inject into the muscle. (Patient not taking: Reported on 10/25/2024)   COVID-19 mRNA vaccine, Pfizer, 30 MCG/0.3ML injection Inject into the muscle. (Patient not taking: Reported on 10/25/2024)   influenza vaccine adjuvanted (FLUAD ) 0.5 ML injection Inject into the muscle. (Patient not taking: Reported on 10/25/2024)   influenza vaccine adjuvanted (FLUAD ) 0.5 ML injection Inject into the muscle. (Patient not taking: Reported on 10/25/2024)   nirmatrelvir/ritonavir (PAXLOVID , 300/100,) 20 x 150 MG & 10 x 100MG  TBPK Take 3 tablets by mouth 2 (two) times daily. 300 mg nirmatrelvir (two 150 mg tablets) and 100 mg ritonavir (one 100 mg tablet) with all  3 tablets taken together by mouth twice daily for 5 days. (Patient not taking: Reported on 10/25/2024)   No facility-administered encounter medications on file as of 10/25/2024.   Vitals:   10/25/24 1535 10/25/24 1557  BP: (!) 144/76 136/84   Pulse: 73   Temp: 98.9 F (37.2 C)   Height: 5' 1 (1.549 m) Comment: Per pt   Weight: 157 lb 12.8 oz (71.6 kg)   SpO2: 94% Comment: RA   BMI (Calculated): 29.83     Physical Exam GEN: No acute distress CV: Regular rate and rhythm no murmurs LUNGS: Clear to auscultation bilaterally normal respiratory effort SKIN JOINTS: Warm and dry no rash   Data Reviewed: Imaging:  Cardiac CT 04/29/2022-visualized lungs show groundglass opacities, thickening of the peribronchovascular interstitium  High-resolution CT 07/08/2022-mild basilar peripheral opacities with linear scarring.  Alternate diagnosis.  High resolution CT 07/06/2023-mild patchy air trapping with some improvement.  Interstitial lung disease is stable.  High resolution CT 04/09/2024-increasing bibasal groundglass, subpleural reticular densities, bronchiolectasis.  Worsening compared to 2024 I reviewed images personally  PFTs: 08/12/2022 FVC 2.30 [91%], FEV1 1.84 [102%], F/F 84, TLC 4.20 [91%], DLCO 14.92 [86%] Normal test  10/25/2024 FVC 1.94 [83%], FEV1 1.66 [96%], F/F86, TLC 4.36 [94%], DLCO 12.41 [72%] Mild diffusion defect  Labs: Autoimmune serologies 06/09/2022-positive only for ANA 1: 40 nuclear homogeneous, SSB 2.6 Hypersensitivity panel 06/09/2022-negative  Assessment & Plan Interstitial lung disease.  Possible hypersensitivity pneumonitis Interstitial lung disease with subtle changes on CT scan, showing slight worsening compared to previous scans. Possible contribution from hypersensitivity pneumonitis due to past exposure to down pillows and comforters, which have since been removed. Recent COVID infection in June may have contributed to inflammatory changes in the lungs. Lung function tests show slight improvement in lung volumes but worsening in diffusion capacity. No current inhaler use. No autoimmune diseases identified by rheumatologist. Condition is mild and well-managed and no need for steroids at the  moment. - Will order CT scan and lung function test in six months - Encouraged aerobic exercise such as walking, treadmill, or cycling - Will consider medication if progression continues despite no exposures  Postnasal drip Persistent postnasal drip causing throat irritation. No current treatment discussed. - Continue to monitor symptoms and manage conservatively   Plan/Recommendations: Follow-up CT, PFTs  I personally spent a total of 30 minutes in the care of the patient today including preparing to see the patient, getting/reviewing separately obtained history, performing a medically appropriate exam/evaluation, independently interpreting results, and communicating results.   Lonna Coder MD [facility] 10/25/2024, 3:38 PM  CC: Clarice Nottingham, MD

## 2024-10-25 NOTE — Patient Instructions (Signed)
 Full PFT performed today.

## 2024-10-26 ENCOUNTER — Ambulatory Visit: Payer: Self-pay | Admitting: Pulmonary Disease

## 2024-11-15 NOTE — Progress Notes (Unsigned)
 Cardiology Office Note:  .   Date:  11/16/2024  ID:  [name], DOB [birth year], MRN [id number] PCP: Clarice Nottingham, MD  [facility] Providers Cardiologist:  Wilbert Bihari, MD   History of Present Illness: .    Chief Complaint  Patient presents with   Hyperlipidemia    Kelsey Elliott is a 78 y.o. female with history of ILD, HTN, HLD who presents for the evaluation of coronary calcifications at the request of Clarice Nottingham, MD.   History of Present Illness   Kelsey Elliott is a 78 year old female with interstitial lung disease who presents for evaluation of coronary calcifications. She is accompanied by Zell, who is also a patient. She was referred by her primary care doctor for regular evaluation of coronary calcifications.  She underwent a chest CT for interstitial lung disease, which incidentally found mild calcification in the left anterior descending (LAD) artery. No chest discomfort or trouble breathing has been experienced. Her coronary calcium score from 2013 was 39, placing her in the 44th percentile for her age.  Her past medical history includes interstitial lung disease, hypertension, and hyperlipidemia. She takes medication for hypertension but does not regularly check her blood pressure at home. She has no history of heart attack or stroke.  She has a history of hyperlipidemia but has declined statin therapy due to concerns about side effects, particularly joint pain and cognitive effects. Her LDL cholesterol was noted to be 165.  Her family history is significant for both parents having congestive heart failure. She does not smoke, having quit after college, and drinks wine a couple of times a week. She is retired from the [employer] and has a daughter and a step-grandchild.  She reports bursitis, sciatica, and a cracked vertebra, which limit her ability to exercise.          Problem List ILD Coronary calcifications -CAC 39 (44th percentile) HLD -T chol 246,  HDL 60, LDL 165, TG 121 HTN    ROS: All other ROS reviewed and negative. Pertinent positives noted in the HPI.     Studies Reviewed: SABRA   EKG Interpretation Date/Time:  Thursday November 16 2024 14:28:07 EST Ventricular Rate:  69 PR Interval:  146 QRS Duration:  86 QT Interval:  388 QTC Calculation: 415 R Axis:   58  Text Interpretation: Normal sinus rhythm Normal ECG Confirmed by Barbaraann Kotyk [id number]) on 11/16/2024 2:35:01 PM   Physical Exam:   VS:  BP (!) 143/79   Pulse 69   Ht 5' 1 (1.549 m)   Wt 155 lb (70.3 kg)   SpO2 96%   BMI 29.29 kg/m    Wt Readings from Last 3 Encounters:  11/16/24 155 lb (70.3 kg)  10/25/24 157 lb 12.8 oz (71.6 kg)  04/20/24 158 lb (71.7 kg)    GEN: Well nourished, well developed in no acute distress NECK: No JVD; No carotid bruits CARDIAC: RRR, no murmurs, rubs, gallops RESPIRATORY:  Clear to auscultation without rales, wheezing or rhonchi  ABDOMEN: Soft, non-tender, non-distended EXTREMITIES:  No edema; No deformity  ASSESSMENT AND PLAN: .   Assessment and Plan    Atherosclerotic heart disease of native coronary artery without angina Mild LAD calcification with a coronary calcium score of 39 (44th percentile), indicating moderate risk. No angina or chest discomfort. Discussed calcified and non-calcified plaque risks. Emphasized cholesterol management to stabilize plaque. - Consider Lipitor 10 mg daily or Zetia 10 mg daily for cholesterol management. - Continue  current blood pressure medications. - No need for aspirin.  Mixed hyperlipidemia LDL cholesterol levels 130-165. Discussed statin benefits and side effects. Explained Zetia as an alternative. Emphasized LDL target less than 100 for risk reduction. She is considering options. - Consider Lipitor 10 mg daily or Zetia 10 mg daily. - Aim for LDL cholesterol less than 899. - She may choose to forego medication given her age and mild calcium score which is ok.  Essential  hypertension Hypertension managed with medication. Blood pressure monitored but not regularly at home. - Continue current blood pressure medications.              Follow-up: Return if symptoms worsen or fail to improve.  Signed, Darryle DASEN. Barbaraann, MD, [facility]  [facility]  [address] [place], [place] [address] [phone]  2:57 PM

## 2024-11-16 ENCOUNTER — Ambulatory Visit: Attending: Cardiovascular Disease | Admitting: Cardiovascular Disease

## 2024-11-16 ENCOUNTER — Encounter: Payer: Self-pay | Admitting: Cardiovascular Disease

## 2024-11-16 VITALS — BP 143/79 | HR 69 | Ht 61.0 in | Wt 155.0 lb

## 2024-11-16 DIAGNOSIS — I2510 Atherosclerotic heart disease of native coronary artery without angina pectoris: Secondary | ICD-10-CM | POA: Diagnosis not present

## 2024-11-16 DIAGNOSIS — E782 Mixed hyperlipidemia: Secondary | ICD-10-CM

## 2024-11-16 NOTE — Patient Instructions (Signed)
 Medication Instructions:  Your physician recommends that you continue on your current medications as directed. Please refer to the Current Medication list given to you today.  *If you need a refill on your cardiac medications before your next appointment, please call your pharmacy*   Follow-Up: At [facility], you and your health needs are our priority.  As part of our continuing mission to provide you with exceptional heart care, our providers are all part of one team.  This team includes your primary Cardiologist (physician) and Advanced Practice Providers or APPs (Physician Assistants and Nurse Practitioners) who all work together to provide you with the care you need, when you need it.  Your next appointment:   We will see you on an as needed basis.    Provider:   Dr. Barbaraann   We recommend signing up for the patient portal called MyChart.  Sign up information is provided on this After Visit Summary.  MyChart is used to connect with patients for Virtual Visits (Telemedicine).  Patients are able to view lab/test results, encounter notes, upcoming appointments, etc.  Non-urgent messages can be sent to your provider as well.   To learn more about what you can do with MyChart, go to [email or web address].   Other Instructions Recommendations: Atorvastatin (lipitor) 10mg  once daily or ezetimibe (zetia) 10mg  once daily.

## 2025-04-24 ENCOUNTER — Other Ambulatory Visit

## 2025-04-25 ENCOUNTER — Ambulatory Visit: Admitting: Pulmonary Disease

## 2025-04-25 ENCOUNTER — Encounter
